# Patient Record
Sex: Female | Born: 1940 | Race: White | Hispanic: No | Marital: Married | Smoking: Never smoker
Health system: Southern US, Community
[De-identification: ages and names within clinical notes are randomized; demographics above are authoritative.]

## PROBLEM LIST (undated history)

## (undated) DIAGNOSIS — E7800 Pure hypercholesterolemia, unspecified: Secondary | ICD-10-CM

## (undated) DIAGNOSIS — M1990 Unspecified osteoarthritis, unspecified site: Secondary | ICD-10-CM

## (undated) DIAGNOSIS — C439 Malignant melanoma of skin, unspecified: Secondary | ICD-10-CM

## (undated) HISTORY — PX: ABDOMINAL HYSTERECTOMY: SHX81

## (undated) HISTORY — PX: BACK SURGERY: SHX140

## (undated) HISTORY — PX: TONSILLECTOMY: SUR1361

## (undated) HISTORY — DX: Malignant melanoma of skin, unspecified: C43.9

## (undated) HISTORY — PX: COLONOSCOPY: SHX174

---

## 2003-09-20 ENCOUNTER — Ambulatory Visit (HOSPITAL_COMMUNITY): Admission: RE | Admit: 2003-09-20 | Discharge: 2003-09-20 | Payer: Self-pay | Admitting: Internal Medicine

## 2007-01-12 ENCOUNTER — Ambulatory Visit (HOSPITAL_COMMUNITY): Admission: RE | Admit: 2007-01-12 | Discharge: 2007-01-12 | Payer: Self-pay | Admitting: *Deleted

## 2009-10-09 ENCOUNTER — Ambulatory Visit (HOSPITAL_COMMUNITY): Admission: RE | Admit: 2009-10-09 | Discharge: 2009-10-09 | Payer: Self-pay | Admitting: Family Medicine

## 2009-10-23 ENCOUNTER — Encounter: Admission: RE | Admit: 2009-10-23 | Discharge: 2009-10-23 | Payer: Self-pay | Admitting: Family Medicine

## 2009-11-07 ENCOUNTER — Encounter: Admission: RE | Admit: 2009-11-07 | Discharge: 2009-11-07 | Payer: Self-pay | Admitting: Family Medicine

## 2010-11-02 NOTE — Op Note (Signed)
NAME:  Brooke Chandler, Brooke Chandler                         ACCOUNT NO.:  [id number]   MEDICAL RECORD NO.:  [id number]                   PATIENT TYPE:  AMB   LOCATION:  DAY                                  FACILITY:  [facility]   PHYSICIAN:  Lionel December, M.D.                 DATE OF BIRTH:  [birth year]   DATE OF PROCEDURE:  09/20/2003  DATE OF DISCHARGE:                                 OPERATIVE REPORT   PROCEDURE:  Total colonoscopy.   ENDOSCOPIST:  Lionel December, M.D.   INDICATIONS:  Ms. Janowski is a 70 year old Caucasian female who is here for  screening colonoscopy.  Family history is negative for colorectal carcinoma.  The procedure and risks were reviewed with the patient and informed consent  was obtained.   PREOPERATIVE MEDICATIONS:  Demerol 25 mg IV and Versed 6 mg IV.   FINDINGS:  Procedure performed in endoscopy suite.  The patient's vital  signs and O2 saturation were monitored during the procedure and remained  stable.  The patient was placed in the left lateral recumbent position and  rectal examination was performed.  No abnormality noted on external or  digital exam.   Olympus videoscope was placed in the rectum and advanced under vision into  the sigmoid colon and beyond.  Preparation was satisfactory.  There were a  few small diverticula at sigmoid colon.  The scope was passed to cecum which  was identified by ileocecal valve and appendiceal orifice.  Pictures taken  for the record.  As the scope was withdrawn colonic mucosa was carefully  examined.  There were two small polyps in the midtransverse colon were  ablated by cold biopsy and submitted in 1 container. The mucosa of the rest  of the colon was normal.  Rectal mucosa similarly was normal..   The scope was retroflexed to examine the anorectal junction which was  unremarkable.  The endoscope was straightened and withdrawn.  The patient  tolerated the procedure well.   FINAL DIAGNOSES:  1. Two small polyps ablated by a  cold biopsy from midtransverse colon.  2. A few small diverticula at descending and sigmoid colon.   RECOMMENDATIONS:  1. Standard instructions given.  2. High fiber diet.  3. I will be contacting the patient with the biopsy results and further     recommendations.      ___________________________________________                                            Lionel December, M.D.   NR/MEDQ  D:  09/20/2003  T:  09/20/2003  Job:  [id number]   cc:   Mila Homer. Sudie Bailey, M.D.  [address] [place], [place] [address]  Fax: [phone]

## 2010-12-05 ENCOUNTER — Other Ambulatory Visit: Payer: Self-pay | Admitting: Family Medicine

## 2010-12-05 DIAGNOSIS — M5430 Sciatica, unspecified side: Secondary | ICD-10-CM

## 2010-12-06 ENCOUNTER — Other Ambulatory Visit: Payer: Self-pay

## 2010-12-27 ENCOUNTER — Ambulatory Visit
Admission: RE | Admit: 2010-12-27 | Discharge: 2010-12-27 | Disposition: A | Discharge status: Home / Self Care | Payer: Medicare Other | Source: Ambulatory Visit | Attending: Family Medicine | Admitting: Family Medicine

## 2010-12-27 VITALS — BP 116/60 | HR 77

## 2010-12-27 DIAGNOSIS — M5430 Sciatica, unspecified side: Secondary | ICD-10-CM

## 2010-12-27 NOTE — Progress Notes (Signed)
10:45 pt received in nursg area left leg completely numb, pt unable to stand or bear wt.dd

## 2010-12-27 NOTE — Progress Notes (Signed)
10:53 dr. Alfredo Batty in to visit. Husband sitting with pt.dd

## 2010-12-27 NOTE — Progress Notes (Signed)
1128: Sensation and movement slowly returning to patient's legs, but she still is unable to bear weight on them.  jkl 1200: Pt able to bear weight on legs; wheeled to car. jkl

## 2011-01-15 ENCOUNTER — Other Ambulatory Visit: Payer: Self-pay | Admitting: Family Medicine

## 2011-01-15 DIAGNOSIS — M545 Low back pain: Secondary | ICD-10-CM

## 2011-01-21 ENCOUNTER — Ambulatory Visit
Admission: RE | Admit: 2011-01-21 | Discharge: 2011-01-21 | Disposition: A | Discharge status: Home / Self Care | Payer: Medicare Other | Source: Ambulatory Visit | Attending: Family Medicine | Admitting: Family Medicine

## 2011-01-21 DIAGNOSIS — M545 Low back pain, unspecified: Secondary | ICD-10-CM

## 2011-01-21 MED ORDER — IOHEXOL 180 MG/ML  SOLN
1.0000 mL | Freq: Once | INTRAMUSCULAR | Status: AC | PRN
  Administered 2011-01-21: 1 mL via EPIDURAL

## 2011-01-21 MED ORDER — METHYLPREDNISOLONE ACETATE 40 MG/ML INJ SUSP (RADIOLOG
120.0000 mg | Freq: Once | INTRAMUSCULAR | Status: AC
  Administered 2011-01-21: 120 mg via EPIDURAL

## 2011-06-05 ENCOUNTER — Other Ambulatory Visit (HOSPITAL_COMMUNITY): Payer: Self-pay | Admitting: Family Medicine

## 2011-06-07 ENCOUNTER — Ambulatory Visit (HOSPITAL_COMMUNITY)
Admission: RE | Admit: 2011-06-07 | Discharge: 2011-06-07 | Disposition: A | Discharge status: Home / Self Care | Payer: Medicare Other | Source: Ambulatory Visit | Attending: Family Medicine | Admitting: Family Medicine

## 2011-06-07 DIAGNOSIS — Z13820 Encounter for screening for osteoporosis: Secondary | ICD-10-CM | POA: Insufficient documentation

## 2011-06-07 DIAGNOSIS — Z780 Asymptomatic menopausal state: Secondary | ICD-10-CM | POA: Insufficient documentation

## 2011-07-20 ENCOUNTER — Emergency Department (HOSPITAL_COMMUNITY)
Admission: EM | Admit: 2011-07-20 | Discharge: 2011-07-20 | Disposition: A | Discharge status: Home / Self Care | Payer: Medicare Other | Attending: Emergency Medicine | Admitting: Emergency Medicine

## 2011-07-20 ENCOUNTER — Encounter (HOSPITAL_COMMUNITY): Payer: Self-pay | Admitting: *Deleted

## 2011-07-20 DIAGNOSIS — Z9079 Acquired absence of other genital organ(s): Secondary | ICD-10-CM | POA: Insufficient documentation

## 2011-07-20 DIAGNOSIS — H8110 Benign paroxysmal vertigo, unspecified ear: Secondary | ICD-10-CM | POA: Insufficient documentation

## 2011-07-20 DIAGNOSIS — E7800 Pure hypercholesterolemia, unspecified: Secondary | ICD-10-CM | POA: Insufficient documentation

## 2011-07-20 HISTORY — DX: Pure hypercholesterolemia, unspecified: E78.00

## 2011-07-20 LAB — BASIC METABOLIC PANEL
CO2: 28 mEq/L (ref 19–32)
Calcium: 9.4 mg/dL (ref 8.4–10.5)
Chloride: 106 mEq/L (ref 96–112)
GFR calc Af Amer: >90 mL/min (ref >90)
Glucose, Bld: 91 mg/dL (ref 70–99)
Potassium: 4.5 mEq/L (ref 3.5–5.1)

## 2011-07-20 LAB — DIFFERENTIAL
Lymphocytes Relative: 23 % (ref 12–46)
Lymphs Abs: 1.9 10*3/uL (ref 0.7–4.0)
Neutrophils Relative %: 69 % (ref 43–77)

## 2011-07-20 LAB — CBC
Platelets: 301 10*3/uL (ref 150–400)
RBC: 3.87 MIL/uL (ref 3.87–5.11)
RDW: 12.9 % (ref 11.5–15.5)

## 2011-07-20 MED ORDER — MECLIZINE HCL 12.5 MG PO TABS
25.0000 mg | ORAL_TABLET | Freq: Once | ORAL | Status: AC
  Administered 2011-07-20: 25 mg via ORAL
  Filled 2011-07-20: qty 2

## 2011-07-20 MED ORDER — MECLIZINE HCL 25 MG PO TABS: 25.0000 mg | ORAL_TABLET | Freq: Three times a day (TID) | ORAL | Status: AC | PRN

## 2011-07-20 NOTE — ED Provider Notes (Signed)
History     CSN: [id number]  Arrival date & time 07/20/11  1405   First MD Initiated Contact with Patient 07/20/11 1430      Chief Complaint  Patient presents with  . Dizziness    (Consider location/radiation/quality/duration/timing/severity/associated sxs/prior treatment) The history is provided by the patient.  71 year old female woke up at 4 AM and went to the bathroom and noted intense vertigo. She went back to bed and was doing generally well, but when she got up later in the morning, she noted that she was still having vertigo. She describes vertigo as the room spinning. There is no associated nausea and no balance difficulty. She's not had any headache or ear pain and has denies any hearing loss. She denies tinnitus. Since getting up in the morning, she notes that the vertigo is better when she sits sits or stands and is worse when she lays flat. She has had vertigo in the past. She's not taken any medication. Symptoms are severe.  Past Medical History  Diagnosis Date  . High cholesterol     Past Surgical History  Procedure Date  . Abdominal hysterectomy   . Back surgery   . Tonsillectomy     History reviewed. No pertinent family history.  History  Substance Use Topics  . Smoking status: Never Smoker   . Smokeless tobacco: Not on file  . Alcohol Use: No    OB History    Grav Para Term Preterm Abortions TAB SAB Ect Mult Living                  Review of Systems  All other systems reviewed and are negative.  for a long time, she has had episodes of numbness in her left hand. It will come with no particular pattern, and will only be present for a few minutes at a time. It is her entire hand that gets numb.  Allergies  Bee venom  Home Medications  No current outpatient prescriptions on file.  BP 124/76  Pulse 72  Temp(Src) 97.8 F (36.6 C) (Oral)  Resp 18  Ht 5\' 5"  (1.651 m)  Wt 125 lb (56.7 kg)  BMI 20.80 kg/m2  SpO2 100%  Physical Exam  Nursing  note and vitals reviewed. 71 year old female is resting comfortably and in no acute distress. Vital signs are normal. Oxygen saturation is 100% which is normal. Head is normocephalic and atraumatic. PERRLA, EOMI. TMs are clear. Oropharynx is clear. Neck is supple without adenopathy, JVD, or bruit. Back is nontender. Lungs are clear without rales, wheezes, rhonchi. Heart has regular rate rhythm without murmur. Abdomen is soft, flat, nontender without masses or hepatosplenomegaly. Extremities have no cyanosis or edema. Skin is warm and moist without rash. Neurologic: Mental status is normal, cranial nerves are intact, there no focal motor or sensory deficits. Coordination is normal. Dizziness is somewhat reproduced by head movement. She does not have any nystagmus.  ED Course  Procedures (including critical care time)  Results for orders placed during the hospital encounter of 07/20/11  CBC      Component Value Range   WBC 8.1  4.0 - 10.5 (K/uL)   RBC 3.87  3.87 - 5.11 (MIL/uL)   Hemoglobin 12.5  12.0 - 15.0 (g/dL)   HCT 40.9  81.1 - 91.4 (%)   MCV 96.1  78.0 - 100.0 (fL)   MCH 32.3  26.0 - 34.0 (pg)   MCHC 33.6  30.0 - 36.0 (g/dL)   RDW 78.2  95.6 -  15.5 (%)   Platelets 301  150 - 400 (K/uL)  DIFFERENTIAL      Component Value Range   Neutrophils Relative 69  43 - 77 (%)   Neutro Abs 5.6  1.7 - 7.7 (K/uL)   Lymphocytes Relative 23  12 - 46 (%)   Lymphs Abs 1.9  0.7 - 4.0 (K/uL)   Monocytes Relative 7  3 - 12 (%)   Monocytes Absolute 0.6  0.1 - 1.0 (K/uL)   Eosinophils Relative 1  0 - 5 (%)   Eosinophils Absolute 0.0  0.0 - 0.7 (K/uL)   Basophils Relative 0  0 - 1 (%)   Basophils Absolute 0.0  0.0 - 0.1 (K/uL)  BASIC METABOLIC PANEL      Component Value Range   Sodium 141  135 - 145 (mEq/L)   Potassium 4.5  3.5 - 5.1 (mEq/L)   Chloride 106  96 - 112 (mEq/L)   CO2 28  19 - 32 (mEq/L)   Glucose, Bld 91  70 - 99 (mg/dL)   BUN 9  6 - 23 (mg/dL)   Creatinine, Ser 1.61  0.50 - 1.10  (mg/dL)   Calcium 9.4  8.4 - 09.6 (mg/dL)   GFR calc non Af Amer 88 (*) >90 (mL/min)   GFR calc Af Amer >90  >90 (mL/min)   No results found.  1600: after oral meclizine, dizziness is resolved. She lay down and without recurrence of vertigo.  1. Benign positional vertigo       MDM  Vertigo in a pattern most consistent with benign positional vertigo.        Dione Booze, MD 07/20/11 [phone]

## 2011-07-20 NOTE — ED Notes (Signed)
Pt c/o dizziness while lying down and tingling in her left arm and leg. States that she is not dizzy while she is upright. Denies chest pain, shortness of breath or nausea.

## 2011-12-04 ENCOUNTER — Telehealth (INDEPENDENT_AMBULATORY_CARE_PROVIDER_SITE_OTHER): Payer: Self-pay

## 2011-12-04 NOTE — Telephone Encounter (Signed)
LMOM for pt that appt moved up per Dr Scharlene Gloss request to 6-24.

## 2011-12-09 ENCOUNTER — Ambulatory Visit (INDEPENDENT_AMBULATORY_CARE_PROVIDER_SITE_OTHER): Payer: Medicare Other | Admitting: Surgery

## 2011-12-09 ENCOUNTER — Encounter (INDEPENDENT_AMBULATORY_CARE_PROVIDER_SITE_OTHER): Payer: Self-pay | Admitting: Surgery

## 2011-12-09 VITALS — BP 126/74 | HR 76 | Temp 98.4°F | Resp 14 | Ht 66.0 in | Wt 123.6 lb

## 2011-12-09 DIAGNOSIS — C4370 Malignant melanoma of unspecified lower limb, including hip: Secondary | ICD-10-CM

## 2011-12-09 NOTE — Patient Instructions (Signed)
Melanoma  Melanoma is the least common, but most dangerous, form of skin cancer. This is because it can spread (metastasize) to other organs and can be life-threatening. Melanoma is a cancerous (malignant) tumor that begins in a certain type of cells, called melanocytes. Melanocytes are the cells that produce the color (pigment) called melanin. Melanin colors our skin, hair, eyes, and moles.  CAUSES    The exact cause of melanoma is unknown. You may have a higher risk if you:   Spend or have spent a lot of time in the sun. This includes sunlamp and tanning booth exposure.    Have had sunburns. This put you at a particularly increased risk for melanoma. The more blistering sunburns a person has, the higher the risk.    Spend time in parts of the world with more intense sunlight.    Have fair skin that does not tan easily. You may have a lower risk if you have a darker skin color. However, people with darker skin can get melanoma, especially on the hands and feet (acral areas).    Have a close relative (parent, sibling) who has melanoma.    Have a large number of skin moles (more than 100).   SYMPTOMS    A skin mole is suspicious if it has any of these 5 traits. This is called the ABCDE's of melanoma:   Asymmetry: Irregular shape, not simply round or oval.    Border: Edge of the mole is irregular, not smooth.    Color: Mole may have multiple colors in it, including brown, black, blue, red, or tan.    Diameter: More than 0.2 inches (6 mm) across.    Evolving: Any unusual change or symptoms in the mole, such as pain, itching, stinging, sensitivity, or bleeding.   A mole that is noticeably changing in appearance, or any new mole, should be checked for melanoma. In general, people develop new moles until age 30. New moles after this age should be brought to the attention of your caregiver.  DIAGNOSIS     Your caregiver can look at your skin and find lesions or moles that may be suspicious. A patient may also notice a mole with symptoms or a mole that does not look like most of the other moles on his or her body. This is called the "ugly duckling" sign. A tissue sample (biopsy) examined under a microscope is needed to determine if it is melanoma. The size and extent of the biopsy will depend on the location, size, and appearance of the skin lesion or mole. The biopsy can also reveal whether melanoma has spread to deeper layers of the skin.  TREATMENT    Surgery to completely remove the melanoma is required. Lymph nodes may also be removed. If the melanoma has spread to other organs, such as the liver, lungs, bone, or brain, cancer-fighting drugs (chemotherapy) must be used. Your caregiver will discuss your treatment options with you. You can ask about being included in a clinical trial to evaluate new forms of treatment. Melanoma can occasionally recur years after the initial diagnosis. If you have melanoma, you will need follow-up visits with your caregiver for many years.  PREVENTION    Risk for melanoma can be reduced by minimizing sun exposure. Practice the 3 S's:   Slip on a shirt.    Slop on sunscreen.    Slap on a hat.   Do not spend time in the sun during peak midafternoon hours. Sunscreen/sunblock with SPF   30 or higher and UVA/UVB block should be applied regularly. You should do this even during brief exposure to sunlight. You should also do this on cloudy days and in winter, even though the perceived sunlight is less. Always avoid sunburn! Wear sunglasses that block UV light. Be sure to see your caregiver if you have any new or changing moles.  HOME CARE INSTRUCTIONS     Follow wound care instructions after surgical removal of your melanoma.    Practice good sun avoidance and protective measures as described above.     Let your close family members (parents, children, siblings) know about your diagnosis. This puts them at a higher risk of getting melanoma than the general population.   SEEK MEDICAL CARE IF:     You notice any new moles, or you have any moles that are changing.    You have had a melanoma removed and you notice a new growth near the same location.    You have had a melanoma removed and you experience any new or unexplained health problems.   Document Released: 06/03/2005 Document Revised: 05/23/2011 Document Reviewed: 09/22/2009  Exit[employer] Patient Information 2012 [employer].

## 2011-12-09 NOTE — Progress Notes (Signed)
General Surgery - [facility]  Chief Complaint  Patient presents with  . Brooke Chandler    new pt- eval Brooke Chandler on Rt leg - referral from Dr. Jack Hall, dermatology; primary is Dr. Stephen Knowlton    HISTORY: Patient is an 71-year-old white female with newly diagnosed malignant Brooke Chandler involving the posterior right calf. Patient underwent local excision at the office of her dermatologist. Pathology shows a superficial spreading type malignant Brooke Chandler, Clark's level II, Breslow depth of 0.25 mm.  Patient is now referred for wide local excision to confirm adequate resection.  Past Medical History  Diagnosis Date  . High cholesterol   . Brooke Chandler     right leg  . Osteoporosis      Current Outpatient Prescriptions  Medication Sig Dispense Refill  . Acetaminophen (TYLENOL ARTHRITIS PAIN PO) Take 2 capsules by mouth 2 (two) times daily.      . alendronate (FOSAMAX) 10 MG tablet Take 10 mg by mouth daily before breakfast. Take with a full glass of water on an empty stomach.      . aspirin 81 MG chewable tablet Chew 81 mg by mouth daily.      . calcium-vitamin D (OSCAL WITH D) 500-200 MG-UNIT per tablet Take 1 tablet by mouth 2 (two) times daily.      . pravastatin (PRAVACHOL) 40 MG tablet Take 40 mg by mouth daily.      . raloxifene (EVISTA) 60 MG tablet Take 60 mg by mouth at bedtime.      . vitamin B-12 (CYANOCOBALAMIN) 1000 MCG tablet Take 1,000 mcg by mouth 2 (two) times a week.         Allergies  Allergen Reactions  . Bee Venom      Family History  Problem Relation Age of Onset  . Heart disease Mother   . Cancer Father     prostate  . Heart disease Father      History   Social History  . Marital Status: Married    Spouse Name: N/A    Number of Children: N/A  . Years of Education: N/A   Social History Main Topics  . Smoking status: Never Smoker   . Smokeless tobacco: None  . Alcohol Use: No  . Drug Use: No  . Sexually Active:    Other Topics  Concern  . None   Social History Narrative  . None     REVIEW OF SYSTEMS - PERTINENT POSITIVES ONLY: Denies lymphadenopathy. Denies previous Brooke Chandler.  EXAM: Filed Vitals:   12/09/11 1000  BP: 126/74  Pulse: 76  Temp: 98.4 F (36.9 C)  Resp: 14    HEENT: normocephalic; pupils equal and reactive; sclerae clear; dentition good; mucous membranes moist NECK:  symmetric on extension; no palpable anterior or posterior cervical lymphadenopathy; no supraclavicular masses; no tenderness CHEST: clear to auscultation bilaterally without rales, rhonchi, or wheezes CARDIAC: regular rate and rhythm without significant murmur; peripheral pulses are full EXT:  non-tender without edema; no deformity; 1.5 cm ulcerated wound right posterior calf with dry eschar; no sign of cellulitis NEURO: no gross focal deficits; no sign of tremor   LABORATORY RESULTS: See [facility] (CHL-Epic) for most recent results   RADIOLOGY RESULTS: See [facility] (CHL-Epic) for most recent results   IMPRESSION: Malignant Brooke Chandler, superficial spreading type, Clark's level II, right posterior calf  PLAN: I discussed the above findings with the patient and her husband. We will schedule her for wide local excision of the lesion on the   right posterior calf under local anesthesia and sedation as an outpatient procedure. This will be done at the [facility].  The risks and benefits of the procedure have been discussed at length with the patient.  The patient understands the proposed procedure, potential alternative treatments, and the course of recovery to be expected.  All of the patient's questions have been answered at this time.  The patient wishes to proceed with surgery.  12/09/2011  Christabel Camire M. Secret Kristensen, MD, FACS General & Endocrine Surgery [facility]   Visit Diagnoses: 1. Brooke Chandler of lower leg, right     Primary Care Physician: KNOWLTON,STEPHEN D, MD   

## 2011-12-11 ENCOUNTER — Encounter (HOSPITAL_BASED_OUTPATIENT_CLINIC_OR_DEPARTMENT_OTHER): Payer: Self-pay | Admitting: *Deleted

## 2011-12-11 NOTE — Progress Notes (Signed)
No labs needed

## 2011-12-17 ENCOUNTER — Ambulatory Visit (HOSPITAL_BASED_OUTPATIENT_CLINIC_OR_DEPARTMENT_OTHER): Payer: Medicare Other | Admitting: Anesthesiology

## 2011-12-17 ENCOUNTER — Encounter (HOSPITAL_BASED_OUTPATIENT_CLINIC_OR_DEPARTMENT_OTHER): Payer: Self-pay | Admitting: *Deleted

## 2011-12-17 ENCOUNTER — Encounter (HOSPITAL_BASED_OUTPATIENT_CLINIC_OR_DEPARTMENT_OTHER): Payer: Self-pay | Admitting: Surgery

## 2011-12-17 ENCOUNTER — Encounter (HOSPITAL_BASED_OUTPATIENT_CLINIC_OR_DEPARTMENT_OTHER)
Admission: RE | Disposition: A | Discharge status: Home / Self Care | Payer: Self-pay | Source: Ambulatory Visit | Attending: Surgery

## 2011-12-17 ENCOUNTER — Encounter (HOSPITAL_BASED_OUTPATIENT_CLINIC_OR_DEPARTMENT_OTHER): Payer: Self-pay | Admitting: Anesthesiology

## 2011-12-17 ENCOUNTER — Ambulatory Visit (HOSPITAL_BASED_OUTPATIENT_CLINIC_OR_DEPARTMENT_OTHER)
Admission: RE | Admit: 2011-12-17 | Discharge: 2011-12-17 | Disposition: A | Discharge status: Home / Self Care | Payer: Medicare Other | Source: Ambulatory Visit | Attending: Surgery | Admitting: Surgery

## 2011-12-17 DIAGNOSIS — C4370 Malignant melanoma of unspecified lower limb, including hip: Secondary | ICD-10-CM | POA: Insufficient documentation

## 2011-12-17 HISTORY — DX: Unspecified osteoarthritis, unspecified site: M19.90

## 2011-12-17 HISTORY — PX: MELANOMA EXCISION: SHX5266

## 2011-12-17 SURGERY — EXCISION, MELANOMA
Anesthesia: Monitor Anesthesia Care | Site: Leg Lower | Laterality: Right | Wound class: Clean

## 2011-12-17 MED ORDER — METOCLOPRAMIDE HCL 5 MG/ML IJ SOLN: 10.0000 mg | Freq: Once | INTRAMUSCULAR | Status: DC | PRN

## 2011-12-17 MED ORDER — PROPOFOL 10 MG/ML IV EMUL
INTRAVENOUS | Status: DC | PRN
  Administered 2011-12-17: 35 ug/kg/min via INTRAVENOUS

## 2011-12-17 MED ORDER — MIDAZOLAM HCL 5 MG/5ML IJ SOLN
INTRAMUSCULAR | Status: DC | PRN
  Administered 2011-12-17: 1 mg via INTRAVENOUS

## 2011-12-17 MED ORDER — HYDROCODONE-ACETAMINOPHEN 5-325 MG PO TABS: 1.0000 | ORAL_TABLET | ORAL | Status: AC | PRN

## 2011-12-17 MED ORDER — FENTANYL CITRATE 0.05 MG/ML IJ SOLN: 25.0000 ug | INTRAMUSCULAR | Status: DC | PRN

## 2011-12-17 MED ORDER — OXYCODONE HCL 5 MG PO TABS: 5.0000 mg | ORAL_TABLET | Freq: Once | ORAL | Status: DC | PRN

## 2011-12-17 MED ORDER — BUPIVACAINE HCL (PF) 0.5 % IJ SOLN
INTRAMUSCULAR | Status: DC | PRN
  Administered 2011-12-17: 25 mL

## 2011-12-17 MED ORDER — ONDANSETRON HCL 4 MG/2ML IJ SOLN
INTRAMUSCULAR | Status: DC | PRN
  Administered 2011-12-17: 4 mg via INTRAVENOUS

## 2011-12-17 MED ORDER — LIDOCAINE HCL (CARDIAC) 20 MG/ML IV SOLN
INTRAVENOUS | Status: DC | PRN
  Administered 2011-12-17: 50 mg via INTRAVENOUS

## 2011-12-17 MED ORDER — CEFAZOLIN SODIUM 1-5 GM-% IV SOLN
1.0000 g | INTRAVENOUS | Status: AC
  Administered 2011-12-17: 1 g via INTRAVENOUS

## 2011-12-17 MED ORDER — LACTATED RINGERS IV SOLN
INTRAVENOUS | Status: DC
  Administered 2011-12-17 (×2): via INTRAVENOUS

## 2011-12-17 MED ORDER — FENTANYL CITRATE 0.05 MG/ML IJ SOLN
INTRAMUSCULAR | Status: DC | PRN
  Administered 2011-12-17: 25 ug via INTRAVENOUS

## 2011-12-17 SURGICAL SUPPLY — 46 items
APL SKNCLS STERI-STRIP NONHPOA (GAUZE/BANDAGES/DRESSINGS)
BANDAGE GAUZE ELAST BULKY 4 IN (GAUZE/BANDAGES/DRESSINGS) IMPLANT
BENZOIN TINCTURE PRP APPL 2/3 (GAUZE/BANDAGES/DRESSINGS) IMPLANT
BLADE SURG 15 STRL LF DISP TIS (BLADE) ×1 IMPLANT
BLADE SURG 15 STRL SS (BLADE) ×2
BNDG COHESIVE 4X5 TAN STRL (GAUZE/BANDAGES/DRESSINGS) ×2 IMPLANT
CHLORAPREP W/TINT 26ML (MISCELLANEOUS) ×2 IMPLANT
CLEANER CAUTERY TIP 5X5 PAD (MISCELLANEOUS) IMPLANT
CLOTH BEACON ORANGE TIMEOUT ST (SAFETY) ×2 IMPLANT
COVER MAYO STAND STRL (DRAPES) ×2 IMPLANT
COVER TABLE BACK 60X90 (DRAPES) ×2 IMPLANT
DECANTER SPIKE VIAL GLASS SM (MISCELLANEOUS) IMPLANT
DRAPE EXTREMITY T 121X128X90 (DRAPE) ×1 IMPLANT
DRAPE PED LAPAROTOMY (DRAPES) ×2 IMPLANT
DRAPE U-SHAPE 76X120 STRL (DRAPES) IMPLANT
DRAPE UTILITY XL STRL (DRAPES) ×2 IMPLANT
DRSG TEGADERM 4X4.75 (GAUZE/BANDAGES/DRESSINGS) ×1 IMPLANT
ELECT REM PT RETURN 9FT ADLT (ELECTROSURGICAL) ×2
ELECTRODE REM PT RTRN 9FT ADLT (ELECTROSURGICAL) ×1 IMPLANT
GAUZE SPONGE 4X4 12PLY STRL LF (GAUZE/BANDAGES/DRESSINGS) ×2 IMPLANT
GLOVE BIO SURGEON STRL SZ7 (GLOVE) ×1 IMPLANT
GLOVE BIOGEL PI IND STRL 7.0 (GLOVE) ×1 IMPLANT
GLOVE BIOGEL PI INDICATOR 7.0 (GLOVE) ×1
GLOVE SKINSENSE NS SZ7.0 (GLOVE) ×1
GLOVE SKINSENSE STRL SZ7.0 (GLOVE) ×1 IMPLANT
GLOVE SURG ORTHO 8.0 STRL STRW (GLOVE) ×2 IMPLANT
GOWN PREVENTION PLUS XLARGE (GOWN DISPOSABLE) ×2 IMPLANT
GOWN PREVENTION PLUS XXLARGE (GOWN DISPOSABLE) ×3 IMPLANT
NDL HYPO 25X1 1.5 SAFETY (NEEDLE) ×1 IMPLANT
NEEDLE HYPO 25X1 1.5 SAFETY (NEEDLE) ×2 IMPLANT
PACK BASIN DAY SURGERY FS (CUSTOM PROCEDURE TRAY) ×2 IMPLANT
PAD CLEANER CAUTERY TIP 5X5 (MISCELLANEOUS)
PENCIL BUTTON HOLSTER BLD 10FT (ELECTRODE) ×2 IMPLANT
SHEET MEDIUM DRAPE 40X70 STRL (DRAPES) ×1 IMPLANT
SLEEVE SCD COMPRESS KNEE MED (MISCELLANEOUS) IMPLANT
SPONGE GAUZE 4X4 12PLY (GAUZE/BANDAGES/DRESSINGS) ×1 IMPLANT
STOCKINETTE 4X48 STRL (DRAPES) IMPLANT
STRIP CLOSURE SKIN 1/2X4 (GAUZE/BANDAGES/DRESSINGS) IMPLANT
SUT ETHILON 3 0 PS 1 (SUTURE) ×3 IMPLANT
SUT VICRYL 3-0 CR8 SH (SUTURE) ×1 IMPLANT
SUT VICRYL 4-0 PS2 18IN ABS (SUTURE) IMPLANT
SYR CONTROL 10ML LL (SYRINGE) ×2 IMPLANT
TAPE CLOTH SURG 4X10 WHT LF (GAUZE/BANDAGES/DRESSINGS) ×1 IMPLANT
TOWEL OR 17X24 6PK STRL BLUE (TOWEL DISPOSABLE) ×4 IMPLANT
TOWEL OR NON WOVEN STRL DISP B (DISPOSABLE) ×2 IMPLANT
WATER STERILE IRR 1000ML POUR (IV SOLUTION) ×1 IMPLANT

## 2011-12-17 NOTE — Op Note (Signed)
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  PROCEDURE:  Wide local excision with 1 cm margin, malignant melanoma, right posterior calf.  SURGEON:  Velora Heckler, MD, FACS  ANESTHESIA:  Local with intravenous sedation.  PREPARATION:  ChloraPrep.  COMPLICATIONS:  None.  INDICATIONS:  The patient is a 71 year old white female, referred by her dermatologist for wide local excision of superficial spreading, thin, malignant melanoma.  BODY OF REPORT:  Procedure was done in OR number 6 at the [facility].  The patient was brought to the operating room and placed in a left lateral decubitus position.  Following administration of intravenous sedation, the patient was prepped and draped in the usual strict aseptic fashion.  After ascertaining that an adequate level with sedation had been achieved, the skin around the ulcerated lesion on the right posterior calf was measured and marked.  A 1-cm margin was needed. Using a #15 blade, an elliptical incision was made so as to encompass the entire lesion with a 1-cm margin.  Dissection was carried full- thickness through the skin and subcutaneous tissues down to muscle fascia.  The entire ellipse of skin was excised.  A single sutures used to mark the superior margin.  A double sutures used to mark the lateral margin.  The specimen was submitted to Pathology in its entirety.  Good hemostasis was achieved with electrocautery.  Skin flaps were elevated circumferentially.   Subcutaneous tissues were closed with interrupted 3-0 Vicryl simple sutures.  The skin was closed with interrupted 3-0 nylon simple and vertical mattress sutures.  Antibiotic ointment was placed along the suture line.  Dry gauze dressings were placed.  The patient was awakened from anesthesia and brought to the recovery room.  The patient tolerated the procedure well.  Velora Heckler, MD, [facility] Office: [phone]    TMG/MEDQ  D:  12/17/2011  T:  12/17/2011  Job:  [id number]  cc:   Mertha Finders., M.D.

## 2011-12-17 NOTE — Transfer of Care (Signed)
Immediate Anesthesia Transfer of Care Note  Patient: Brooke Chandler  Procedure(s) Performed: Procedure(s) (LRB): MELANOMA EXCISION (Right)  Patient Location: PACU  Anesthesia Type: MAC  Level of Consciousness: awake and alert   Airway & Oxygen Therapy: Patient Spontanous Breathing and Patient connected to face mask oxygen  Post-op Assessment: Report given to PACU RN and Post -op Vital signs reviewed and stable  Post vital signs: Reviewed and stable  Complications: No apparent anesthesia complications

## 2011-12-17 NOTE — Anesthesia Postprocedure Evaluation (Signed)
Anesthesia Post Note  Patient: Brooke Chandler  Procedure(s) Performed: Procedure(s) (LRB): MELANOMA EXCISION (Right)  Anesthesia type: MAC  Patient location: PACU  Post pain: Pain level controlled  Post assessment: Patient's Cardiovascular Status Stable  Last Vitals:  Filed Vitals:   12/17/11 0912  BP: 128/50  Pulse: 71  Temp: 36.6 C  Resp: 20    Post vital signs: Reviewed and stable  Level of consciousness: alert  Complications: No apparent anesthesia complications

## 2011-12-17 NOTE — Interval H&P Note (Signed)
History and Physical Interval Note:  12/17/2011 7:19 AM  Brooke Chandler  has presented today for surgery, with the diagnosis of melanoma right calf.  The various methods of treatment have been discussed with the patient and family. After consideration of risks, benefits and other options for treatment, the patient has consented to    Procedure(s) (LRB): MELANOMA EXCISION (Right) as a surgical intervention .    The patient's history has been reviewed, patient examined, no change in status, stable for surgery.  I have reviewed the patients' chart and labs.  Questions were answered to the patient's satisfaction.    Velora Heckler, MD, [facility] Office: [phone]    July Linam Judie Petit

## 2011-12-17 NOTE — Anesthesia Preprocedure Evaluation (Signed)
Anesthesia Evaluation  Patient identified by MRN, date of birth, ID band Patient awake    Reviewed: Allergy & Precautions, H&P , NPO status , Patient's Chart, lab work & pertinent test results, reviewed documented beta blocker date and time   Airway Mallampati: II TM Distance: >3 FB Neck ROM: full    Dental   Pulmonary neg pulmonary ROS,          Cardiovascular negative cardio ROS      Neuro/Psych negative neurological ROS  negative psych ROS   GI/Hepatic negative GI ROS, Neg liver ROS,   Endo/Other  negative endocrine ROS  Renal/GU negative Renal ROS  negative genitourinary   Musculoskeletal   Abdominal   Peds  Hematology negative hematology ROS (+)   Anesthesia Other Findings See surgeon's H&P   Reproductive/Obstetrics negative OB ROS                           Anesthesia Physical Anesthesia Plan  ASA: II  Anesthesia Plan: MAC   Post-op Pain Management:    Induction:   Airway Management Planned: Simple Face Mask  Additional Equipment:   Intra-op Plan:   Post-operative Plan:   Informed Consent: I have reviewed the patients History and Physical, chart, labs and discussed the procedure including the risks, benefits and alternatives for the proposed anesthesia with the patient or authorized representative who has indicated his/her understanding and acceptance.   Dental Advisory Given  Plan Discussed with: CRNA and Surgeon  Anesthesia Plan Comments:         Anesthesia Quick Evaluation

## 2011-12-17 NOTE — H&P (View-Only) (Signed)
[facility]  Chief Complaint  Patient presents with  . Brooke Chandler    new pt- eval Brooke Chandler on Rt leg - referral from Dr. Suan Halter, dermatology; primary is Dr. 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 Patient underwent local excision at the office of her dermatologist. Pathology shows a superficial spreading type malignant Brooke Chandler, Clark's level II, Breslow depth of 0.25 mm.  Patient is now referred for wide local excision to confirm adequate resection.  Past Medical History  Diagnosis Date  . High cholesterol   . Brooke Chandler     right leg  . Osteoporosis      Current Outpatient Prescriptions  Medication Sig Dispense Refill  . Acetaminophen (TYLENOL ARTHRITIS PAIN PO) Take 2 capsules by mouth 2 (two) times daily.      Marland Kitchen alendronate (FOSAMAX) 10 MG tablet Take 10 mg by mouth daily before breakfast. Take with a full glass of water on an empty stomach.      Marland Kitchen aspirin 81 MG chewable tablet Chew 81 mg by mouth daily.      . calcium-vitamin D (OSCAL WITH D) 500-200 MG-UNIT per tablet Take 1 tablet by mouth 2 (two) times daily.      . pravastatin (PRAVACHOL) 40 MG tablet Take 40 mg by mouth daily.      . raloxifene (EVISTA) 60 MG tablet Take 60 mg by mouth at bedtime.      . vitamin B-12 (CYANOCOBALAMIN) 1000 MCG tablet Take 1,000 mcg by mouth 2 (two) times a week.         Allergies  Allergen Reactions  . Bee Venom      Family History  Problem Relation Age of Onset  . Heart disease Mother   . Cancer Father     prostate  . Heart disease Father      History   Social History  . Marital Status: Married    Spouse Name: N/A    Number of Children: N/A  . Years of Education: N/A   Social History Main Topics  . Smoking status: Never Smoker   . Smokeless tobacco: None  . Alcohol Use: No  . Drug Use: No  . Sexually Active:    Other Topics  Concern  . None   Social History Narrative  . None     REVIEW OF SYSTEMS - PERTINENT POSITIVES ONLY: Denies lymphadenopathy. Denies previous Brooke Chandler.  EXAM: Filed Vitals:   12/09/11 1000  BP: 126/74  Pulse: 76  Temp: 98.4 F (36.9 C)  Resp: 14    HEENT: normocephalic; pupils equal and reactive; sclerae clear; dentition good; mucous membranes moist NECK:  symmetric on extension; no palpable anterior or posterior cervical lymphadenopathy; no supraclavicular masses; no tenderness CHEST: clear to auscultation bilaterally without rales, rhonchi, or wheezes CARDIAC: regular rate and rhythm without significant murmur; peripheral pulses are full EXT:  non-tender without edema; no deformity; 1.5 cm ulcerated wound right posterior calf with dry eschar; no sign of cellulitis NEURO: no gross focal deficits; no sign of tremor   LABORATORY RESULTS: See [facility] (CHL-Epic) for most recent results   RADIOLOGY RESULTS: See [facility] (CHL-Epic) for most recent results   IMPRESSION: Malignant Brooke Chandler, superficial spreading type, Clark's level II, right posterior calf  PLAN: I discussed the above findings with the patient and her husband. We will schedule her for wide local excision of the lesion on the  right posterior calf under local anesthesia and sedation as an outpatient procedure. This will be done at the [facility].  The risks and benefits of the procedure have been discussed at length with the patient.  The patient understands the proposed procedure, potential alternative treatments, and the course of recovery to be expected.  All of the patient's questions have been answered at this time.  The patient wishes to proceed with surgery.  12/09/2011  Velora Heckler, MD, FACS General & Endocrine Surgery [facility]   Visit Diagnoses: 1. Brooke Chandler of lower leg, right     Primary Care Physician: Milana Obey, MD

## 2011-12-17 NOTE — Brief Op Note (Signed)
12/17/2011  8:19 AM  PATIENT:  Brooke Chandler  71 y.o. female  PRE-OPERATIVE DIAGNOSIS:  melanoma right calf  POST-OPERATIVE DIAGNOSIS:  melanoma right calf  PROCEDURE:  Wide local excision of malignant melanoma, 1 cm margin, right posterior calf  SURGEON:  Surgeon(s) and Role:    * Velora Heckler, MD - Primary  ANESTHESIA:   IV sedation  EBL:     BLOOD ADMINISTERED:none  DRAINS: none   LOCAL MEDICATIONS USED:  MARCAINE     SPECIMEN:  Excision  DISPOSITION OF SPECIMEN:  PATHOLOGY  COUNTS:  YES  TOURNIQUET:  * No tourniquets in log *  DICTATION: .Other Dictation: Dictation Number [id number]  PLAN OF CARE: Discharge to home after PACU  PATIENT DISPOSITION:  PACU - hemodynamically stable.   Delay start of Pharmacological VTE agent (>24hrs) due to surgical blood loss or risk of bleeding: yes  Velora Heckler, MD, [facility] Office: [phone]

## 2011-12-17 NOTE — Discharge Instructions (Signed)

## 2011-12-18 ENCOUNTER — Ambulatory Visit (INDEPENDENT_AMBULATORY_CARE_PROVIDER_SITE_OTHER): Payer: Medicare Other | Admitting: Surgery

## 2011-12-18 NOTE — Progress Notes (Signed)
Quick Note:  Please contact patient and notify of benign pathology results.  Brooke Chandler M. Daishon Chui, MD, FACS [facility] Office: [phone]   ______ 

## 2011-12-20 ENCOUNTER — Encounter (HOSPITAL_BASED_OUTPATIENT_CLINIC_OR_DEPARTMENT_OTHER): Payer: Self-pay | Admitting: Surgery

## 2011-12-31 ENCOUNTER — Encounter (INDEPENDENT_AMBULATORY_CARE_PROVIDER_SITE_OTHER): Payer: Self-pay | Admitting: Surgery

## 2011-12-31 ENCOUNTER — Encounter (INDEPENDENT_AMBULATORY_CARE_PROVIDER_SITE_OTHER): Payer: Self-pay

## 2011-12-31 ENCOUNTER — Ambulatory Visit (INDEPENDENT_AMBULATORY_CARE_PROVIDER_SITE_OTHER): Payer: Medicare Other | Admitting: Surgery

## 2011-12-31 VITALS — BP 108/64 | HR 80 | Temp 98.7°F | Ht 65.0 in | Wt 123.2 lb

## 2011-12-31 DIAGNOSIS — C4370 Malignant melanoma of unspecified lower limb, including hip: Secondary | ICD-10-CM

## 2011-12-31 NOTE — Progress Notes (Signed)
General Surgery [facility]  Visit Diagnoses: 1. Melanoma of lower leg, right     HISTORY: Patient returns for postoperative visit 2 weeks after having undergone wide local excision of malignant melanoma from the right posterior calf. Final pathology showed and ulcerated skin biopsy site with no residual melanoma identified.  EXAM: Wound is well healed. Sutures are removed and benzoin and Steri-Strips are applied.  IMPRESSION: Status post wide local excision of malignant melanoma right calf, no residual malignancy  PLAN: Wound care instructions are given to the patient. Steri-Strips will be removed in 5 days. Patient will begin applying topical creams to her incision. Patient will return to see me as needed.  Velora Heckler, MD, FACS General & Endocrine Surgery [facility]

## 2011-12-31 NOTE — Patient Instructions (Signed)
  COCOA BUTTER & VITAMIN E CREAM  (Palmer's or other brand)  Apply cocoa butter/vitamin E cream to your incision 2 - 3 times daily.  Massage cream into incision for one minute with each application.  Use sunscreen (50 SPF or higher) for first 6 months after surgery if area is exposed to sun.  You may substitute Mederma or other scar reducing creams as desired.   

## 2013-06-16 ENCOUNTER — Other Ambulatory Visit (HOSPITAL_COMMUNITY): Payer: Self-pay | Admitting: Internal Medicine

## 2013-06-16 DIAGNOSIS — Z780 Asymptomatic menopausal state: Secondary | ICD-10-CM

## 2013-06-21 ENCOUNTER — Ambulatory Visit (HOSPITAL_COMMUNITY)
Admission: RE | Admit: 2013-06-21 | Discharge: 2013-06-21 | Disposition: A | Discharge status: Home / Self Care | Payer: Medicare Other | Source: Ambulatory Visit | Attending: Internal Medicine | Admitting: Internal Medicine

## 2013-06-21 DIAGNOSIS — M949 Disorder of cartilage, unspecified: Secondary | ICD-10-CM

## 2013-06-21 DIAGNOSIS — M899 Disorder of bone, unspecified: Secondary | ICD-10-CM | POA: Insufficient documentation

## 2013-06-21 DIAGNOSIS — Z90710 Acquired absence of both cervix and uterus: Secondary | ICD-10-CM | POA: Insufficient documentation

## 2013-06-21 DIAGNOSIS — M818 Other osteoporosis without current pathological fracture: Secondary | ICD-10-CM | POA: Insufficient documentation

## 2013-06-21 DIAGNOSIS — Z780 Asymptomatic menopausal state: Secondary | ICD-10-CM

## 2013-06-21 DIAGNOSIS — E559 Vitamin D deficiency, unspecified: Secondary | ICD-10-CM | POA: Insufficient documentation

## 2014-04-13 ENCOUNTER — Ambulatory Visit (HOSPITAL_COMMUNITY)
Admission: RE | Admit: 2014-04-13 | Discharge: 2014-04-13 | Disposition: A | Discharge status: Home / Self Care | Payer: Medicare Other | Source: Ambulatory Visit | Attending: Diagnostic Radiology | Admitting: Diagnostic Radiology

## 2014-04-13 ENCOUNTER — Other Ambulatory Visit (HOSPITAL_COMMUNITY): Payer: Self-pay | Admitting: Family Medicine

## 2014-04-13 DIAGNOSIS — M79672 Pain in left foot: Secondary | ICD-10-CM | POA: Diagnosis present

## 2014-04-13 DIAGNOSIS — W19XXXA Unspecified fall, initial encounter: Secondary | ICD-10-CM | POA: Insufficient documentation

## 2014-04-13 DIAGNOSIS — R609 Edema, unspecified: Secondary | ICD-10-CM

## 2014-04-13 DIAGNOSIS — S92355A Nondisplaced fracture of fifth metatarsal bone, left foot, initial encounter for closed fracture: Secondary | ICD-10-CM | POA: Diagnosis not present

## 2014-04-13 DIAGNOSIS — Y92512 Supermarket, store or market as the place of occurrence of the external cause: Secondary | ICD-10-CM | POA: Diagnosis not present

## 2014-04-13 DIAGNOSIS — R52 Pain, unspecified: Secondary | ICD-10-CM

## 2014-06-20 ENCOUNTER — Ambulatory Visit (HOSPITAL_COMMUNITY)
Admission: RE | Admit: 2014-06-20 | Discharge: 2014-06-20 | Disposition: A | Discharge status: Home / Self Care | Payer: Medicare Other | Source: Ambulatory Visit | Attending: Family Medicine | Admitting: Family Medicine

## 2014-06-20 ENCOUNTER — Other Ambulatory Visit (HOSPITAL_COMMUNITY): Payer: Self-pay | Admitting: Family Medicine

## 2014-06-20 DIAGNOSIS — X58XXXD Exposure to other specified factors, subsequent encounter: Secondary | ICD-10-CM | POA: Insufficient documentation

## 2014-06-20 DIAGNOSIS — M7732 Calcaneal spur, left foot: Secondary | ICD-10-CM | POA: Insufficient documentation

## 2014-06-20 DIAGNOSIS — M79672 Pain in left foot: Secondary | ICD-10-CM | POA: Diagnosis present

## 2014-06-20 DIAGNOSIS — M84378D Stress fracture, left toe(s), subsequent encounter for fracture with routine healing: Secondary | ICD-10-CM | POA: Insufficient documentation

## 2014-06-20 DIAGNOSIS — M7989 Other specified soft tissue disorders: Secondary | ICD-10-CM | POA: Insufficient documentation

## 2014-06-20 DIAGNOSIS — M2012 Hallux valgus (acquired), left foot: Secondary | ICD-10-CM | POA: Insufficient documentation

## 2014-07-29 ENCOUNTER — Other Ambulatory Visit (HOSPITAL_COMMUNITY): Payer: Self-pay | Admitting: Family Medicine

## 2014-07-29 ENCOUNTER — Ambulatory Visit (HOSPITAL_COMMUNITY)
Admission: RE | Admit: 2014-07-29 | Discharge: 2014-07-29 | Disposition: A | Discharge status: Home / Self Care | Payer: Medicare Other | Source: Ambulatory Visit | Attending: Family Medicine | Admitting: Family Medicine

## 2014-07-29 DIAGNOSIS — M84375G Stress fracture, left foot, subsequent encounter for fracture with delayed healing: Secondary | ICD-10-CM | POA: Diagnosis not present

## 2014-07-29 DIAGNOSIS — X58XXXD Exposure to other specified factors, subsequent encounter: Secondary | ICD-10-CM | POA: Diagnosis not present

## 2014-07-29 DIAGNOSIS — M8430XA Stress fracture, unspecified site, initial encounter for fracture: Secondary | ICD-10-CM

## 2014-07-29 DIAGNOSIS — M84375D Stress fracture, left foot, subsequent encounter for fracture with routine healing: Secondary | ICD-10-CM | POA: Diagnosis present

## 2014-09-30 ENCOUNTER — Ambulatory Visit (HOSPITAL_COMMUNITY)
Admission: RE | Admit: 2014-09-30 | Discharge: 2014-09-30 | Disposition: A | Discharge status: Home / Self Care | Payer: Medicare Other | Source: Ambulatory Visit | Attending: Family Medicine | Admitting: Family Medicine

## 2014-09-30 ENCOUNTER — Other Ambulatory Visit (HOSPITAL_COMMUNITY): Payer: Self-pay | Admitting: Family Medicine

## 2014-09-30 DIAGNOSIS — S92355K Nondisplaced fracture of fifth metatarsal bone, left foot, subsequent encounter for fracture with nonunion: Secondary | ICD-10-CM | POA: Diagnosis not present

## 2014-09-30 DIAGNOSIS — S92355G Nondisplaced fracture of fifth metatarsal bone, left foot, subsequent encounter for fracture with delayed healing: Secondary | ICD-10-CM

## 2014-09-30 DIAGNOSIS — X58XXXD Exposure to other specified factors, subsequent encounter: Secondary | ICD-10-CM | POA: Diagnosis not present

## 2014-12-22 ENCOUNTER — Ambulatory Visit (HOSPITAL_COMMUNITY)
Admission: RE | Admit: 2014-12-22 | Discharge: 2014-12-22 | Disposition: A | Discharge status: Home / Self Care | Payer: Medicare Other | Source: Ambulatory Visit | Attending: Family Medicine | Admitting: Family Medicine

## 2014-12-22 ENCOUNTER — Other Ambulatory Visit (HOSPITAL_COMMUNITY): Payer: Self-pay | Admitting: Family Medicine

## 2014-12-22 DIAGNOSIS — S92352K Displaced fracture of fifth metatarsal bone, left foot, subsequent encounter for fracture with nonunion: Secondary | ICD-10-CM | POA: Insufficient documentation

## 2014-12-22 DIAGNOSIS — X58XXXD Exposure to other specified factors, subsequent encounter: Secondary | ICD-10-CM | POA: Diagnosis not present

## 2014-12-22 DIAGNOSIS — M79672 Pain in left foot: Secondary | ICD-10-CM

## 2017-01-01 ENCOUNTER — Other Ambulatory Visit (HOSPITAL_COMMUNITY): Payer: Self-pay | Admitting: Family Medicine

## 2017-01-01 DIAGNOSIS — M8580 Other specified disorders of bone density and structure, unspecified site: Secondary | ICD-10-CM

## 2017-01-01 DIAGNOSIS — Z780 Asymptomatic menopausal state: Secondary | ICD-10-CM

## 2017-01-06 ENCOUNTER — Other Ambulatory Visit (HOSPITAL_COMMUNITY): Payer: Self-pay | Admitting: Family Medicine

## 2017-01-06 DIAGNOSIS — R946 Abnormal results of thyroid function studies: Secondary | ICD-10-CM

## 2017-01-08 ENCOUNTER — Ambulatory Visit (HOSPITAL_COMMUNITY)
Admission: RE | Admit: 2017-01-08 | Discharge: 2017-01-08 | Disposition: A | Discharge status: Home / Self Care | Payer: Medicare Other | Source: Ambulatory Visit | Attending: Family Medicine | Admitting: Family Medicine

## 2017-01-08 ENCOUNTER — Other Ambulatory Visit (HOSPITAL_COMMUNITY): Payer: Self-pay | Admitting: Family Medicine

## 2017-01-08 DIAGNOSIS — M4316 Spondylolisthesis, lumbar region: Secondary | ICD-10-CM | POA: Insufficient documentation

## 2017-01-08 DIAGNOSIS — M419 Scoliosis, unspecified: Secondary | ICD-10-CM | POA: Diagnosis not present

## 2017-01-08 DIAGNOSIS — M545 Low back pain: Secondary | ICD-10-CM | POA: Diagnosis present

## 2017-01-08 DIAGNOSIS — M5136 Other intervertebral disc degeneration, lumbar region: Secondary | ICD-10-CM | POA: Insufficient documentation

## 2017-01-08 DIAGNOSIS — Z780 Asymptomatic menopausal state: Secondary | ICD-10-CM

## 2017-01-08 DIAGNOSIS — M8580 Other specified disorders of bone density and structure, unspecified site: Secondary | ICD-10-CM

## 2017-01-09 ENCOUNTER — Encounter (HOSPITAL_COMMUNITY)
Admission: RE | Admit: 2017-01-09 | Discharge: 2017-01-09 | Disposition: A | Discharge status: Home / Self Care | Payer: Medicare Other | Source: Ambulatory Visit | Attending: Family Medicine | Admitting: Family Medicine

## 2017-01-09 DIAGNOSIS — R946 Abnormal results of thyroid function studies: Secondary | ICD-10-CM

## 2017-01-09 MED ORDER — SODIUM IODIDE I-123 7.4 MBQ CAPS
400.0000 | ORAL_CAPSULE | Freq: Once | ORAL | Status: AC
  Administered 2017-01-09: 393 via ORAL

## 2017-01-10 ENCOUNTER — Encounter (HOSPITAL_COMMUNITY)
Admission: RE | Admit: 2017-01-10 | Discharge: 2017-01-10 | Disposition: A | Discharge status: Home / Self Care | Payer: Medicare Other | Source: Ambulatory Visit | Attending: Family Medicine | Admitting: Family Medicine

## 2017-01-10 DIAGNOSIS — R946 Abnormal results of thyroid function studies: Secondary | ICD-10-CM | POA: Diagnosis present

## 2017-01-22 ENCOUNTER — Other Ambulatory Visit: Payer: Self-pay | Admitting: Family Medicine

## 2017-01-22 ENCOUNTER — Other Ambulatory Visit (HOSPITAL_COMMUNITY): Payer: Self-pay | Admitting: Family Medicine

## 2017-01-22 DIAGNOSIS — E041 Nontoxic single thyroid nodule: Secondary | ICD-10-CM

## 2017-01-28 ENCOUNTER — Ambulatory Visit (HOSPITAL_COMMUNITY)
Admission: RE | Admit: 2017-01-28 | Discharge: 2017-01-28 | Disposition: A | Discharge status: Home / Self Care | Payer: Medicare Other | Source: Ambulatory Visit | Attending: Family Medicine | Admitting: Family Medicine

## 2017-01-28 DIAGNOSIS — E041 Nontoxic single thyroid nodule: Secondary | ICD-10-CM | POA: Diagnosis not present

## 2017-01-31 ENCOUNTER — Other Ambulatory Visit: Payer: Self-pay | Admitting: Family Medicine

## 2017-01-31 DIAGNOSIS — E041 Nontoxic single thyroid nodule: Secondary | ICD-10-CM

## 2017-02-13 ENCOUNTER — Ambulatory Visit
Admission: RE | Admit: 2017-02-13 | Discharge: 2017-02-13 | Disposition: A | Discharge status: Home / Self Care | Payer: Medicare Other | Source: Ambulatory Visit | Attending: Family Medicine | Admitting: Family Medicine

## 2017-02-13 ENCOUNTER — Other Ambulatory Visit (HOSPITAL_COMMUNITY)
Admission: RE | Admit: 2017-02-13 | Discharge: 2017-02-13 | Disposition: A | Discharge status: Home / Self Care | Payer: Medicare Other | Source: Ambulatory Visit | Attending: Radiology | Admitting: Radiology

## 2017-02-13 DIAGNOSIS — E041 Nontoxic single thyroid nodule: Secondary | ICD-10-CM | POA: Diagnosis present

## 2017-10-13 ENCOUNTER — Encounter (HOSPITAL_COMMUNITY): Payer: Self-pay | Admitting: Emergency Medicine

## 2017-10-13 ENCOUNTER — Other Ambulatory Visit: Payer: Self-pay

## 2017-10-13 ENCOUNTER — Emergency Department (HOSPITAL_COMMUNITY)
Admission: EM | Admit: 2017-10-13 | Discharge: 2017-10-13 | Disposition: A | Discharge status: Home / Self Care | Payer: Medicare Other | Attending: Emergency Medicine | Admitting: Emergency Medicine

## 2017-10-13 DIAGNOSIS — Z7982 Long term (current) use of aspirin: Secondary | ICD-10-CM | POA: Diagnosis not present

## 2017-10-13 DIAGNOSIS — K521 Toxic gastroenteritis and colitis: Secondary | ICD-10-CM

## 2017-10-13 DIAGNOSIS — R32 Unspecified urinary incontinence: Secondary | ICD-10-CM | POA: Diagnosis present

## 2017-10-13 DIAGNOSIS — T3695XA Adverse effect of unspecified systemic antibiotic, initial encounter: Secondary | ICD-10-CM

## 2017-10-13 DIAGNOSIS — Z79899 Other long term (current) drug therapy: Secondary | ICD-10-CM | POA: Diagnosis not present

## 2017-10-13 DIAGNOSIS — R197 Diarrhea, unspecified: Secondary | ICD-10-CM | POA: Diagnosis not present

## 2017-10-13 LAB — COMPREHENSIVE METABOLIC PANEL
ALBUMIN: 3.6 g/dL (ref 3.5–5.0)
ALT: 19 U/L (ref 14–54)
AST: 26 U/L (ref 15–41)
Alkaline Phosphatase: 56 U/L (ref 38–126)
Anion gap: 11 (ref 5–15)
BUN: 13 mg/dL (ref 6–20)
CHLORIDE: 104 mmol/L (ref 101–111)
CO2: 22 mmol/L (ref 22–32)
CREATININE: 0.52 mg/dL (ref 0.44–1.00)
Calcium: 8.6 mg/dL — ABNORMAL LOW (ref 8.9–10.3)
GFR calc Af Amer: >60 mL/min (ref >60)
GFR calc non Af Amer: >60 mL/min (ref >60)
GLUCOSE: 118 mg/dL — AB (ref 65–99)
POTASSIUM: 3.3 mmol/L — AB (ref 3.5–5.1)
Sodium: 137 mmol/L (ref 135–145)
Total Bilirubin: 0.8 mg/dL (ref 0.3–1.2)
Total Protein: 6.9 g/dL (ref 6.5–8.1)

## 2017-10-13 LAB — URINALYSIS, ROUTINE W REFLEX MICROSCOPIC
Bilirubin Urine: NEGATIVE
Glucose, UA: NEGATIVE mg/dL
Hgb urine dipstick: NEGATIVE
KETONES UR: 5 mg/dL — AB
LEUKOCYTES UA: NEGATIVE
NITRITE: NEGATIVE
PH: 5 (ref 5.0–8.0)
PROTEIN: NEGATIVE mg/dL
Specific Gravity, Urine: 1.026 (ref 1.005–1.030)

## 2017-10-13 LAB — CBC WITH DIFFERENTIAL/PLATELET
Basophils Absolute: 0 10*3/uL (ref 0.0–0.1)
Basophils Relative: 0 %
EOS PCT: 0 %
Eosinophils Absolute: 0 10*3/uL (ref 0.0–0.7)
HCT: 41.7 % (ref 36.0–46.0)
Hemoglobin: 13.4 g/dL (ref 12.0–15.0)
LYMPHS ABS: 0.7 10*3/uL (ref 0.7–4.0)
LYMPHS PCT: 13 %
MCH: 28.4 pg (ref 26.0–34.0)
MCHC: 32.1 g/dL (ref 30.0–36.0)
MCV: 88.3 fL (ref 78.0–100.0)
MONO ABS: 0.5 10*3/uL (ref 0.1–1.0)
MONOS PCT: 10 %
Neutro Abs: 3.9 10*3/uL (ref 1.7–7.7)
Neutrophils Relative %: 77 %
PLATELETS: 349 10*3/uL (ref 150–400)
RBC: 4.72 MIL/uL (ref 3.87–5.11)
RDW: 13.2 % (ref 11.5–15.5)
WBC: 5.1 10*3/uL (ref 4.0–10.5)

## 2017-10-13 LAB — GASTROINTESTINAL PANEL BY PCR, STOOL (REPLACES STOOL CULTURE)
ADENOVIRUS F40/41: NOT DETECTED
Astrovirus: DETECTED — AB
CAMPYLOBACTER SPECIES: NOT DETECTED
CRYPTOSPORIDIUM: NOT DETECTED
CYCLOSPORA CAYETANENSIS: NOT DETECTED
ENTAMOEBA HISTOLYTICA: NOT DETECTED
ENTEROPATHOGENIC E COLI (EPEC): NOT DETECTED
Enteroaggregative E coli (EAEC): NOT DETECTED
Enterotoxigenic E coli (ETEC): NOT DETECTED
GIARDIA LAMBLIA: NOT DETECTED
Norovirus GI/GII: NOT DETECTED
PLESIMONAS SHIGELLOIDES: NOT DETECTED
Rotavirus A: NOT DETECTED
Salmonella species: NOT DETECTED
Sapovirus (I, II, IV, and V): NOT DETECTED
Shiga like toxin producing E coli (STEC): NOT DETECTED
Shigella/Enteroinvasive E coli (EIEC): NOT DETECTED
VIBRIO SPECIES: NOT DETECTED
Vibrio cholerae: NOT DETECTED
YERSINIA ENTEROCOLITICA: NOT DETECTED

## 2017-10-13 LAB — C DIFFICILE QUICK SCREEN W PCR REFLEX
C Diff antigen: NEGATIVE
C Diff interpretation: NOT DETECTED
C Diff toxin: NEGATIVE

## 2017-10-13 MED ORDER — METRONIDAZOLE 500 MG PO TABS: 500.0000 mg | ORAL_TABLET | Freq: Two times a day (BID) | ORAL | 0 refills | Status: DC

## 2017-10-13 MED ORDER — METRONIDAZOLE 500 MG PO TABS
500.0000 mg | ORAL_TABLET | Freq: Once | ORAL | Status: AC
  Administered 2017-10-13: 500 mg via ORAL
  Filled 2017-10-13: qty 1

## 2017-10-13 NOTE — ED Provider Notes (Signed)
[facility] EMERGENCY DEPARTMENT Provider Note   CSN: [id number] Arrival date & time: 10/13/17  0405     History   Chief Complaint Chief Complaint  Patient presents with  . Vaginal Discharge    HPI Brooke Chandler is a 77 y.o. female.  The history is provided by the patient.  She has a history of hyperlipidemia, osteoporosis, melanoma and comes in with having had 2 episodes of incontinence at home.  She woke up and about midnight to go to the bathroom.  She thought that she went to move her bowels, but noted that she had wet her closed in her bed.  She cleaned it up and went to bed and woke up again with her clothing in bed wet.  She is not 100% sure if it was with stool or with urine.  She denies any abdominal pain or fever.  Of note, she had been given a course of levofloxacin for a respiratory tract infection and she completed that about 2 weeks ago.  She denies any back pain, weakness, numbness, tingling.  Past Medical History:  Diagnosis Date  . Arthritis   . High cholesterol   . Melanoma ([place])    right leg  . Osteoporosis     Patient Active Problem List   Diagnosis Date Noted  . Melanoma of lower leg, right 12/09/2011    Past Surgical History:  Procedure Laterality Date  . ABDOMINAL HYSTERECTOMY    . BACK SURGERY    . COLONOSCOPY    . MELANOMA EXCISION  12/17/2011   Procedure: MELANOMA EXCISION;  Surgeon: Earnstine Regal, MD;  Location: [place];  Service: General;  Laterality: Right;  Wide local excision of malignant melanoma Right calf  . TONSILLECTOMY       OB History   None      Home Medications    Prior to Admission medications   Medication Sig Start Date End Date Taking? Authorizing Provider  meloxicam (MOBIC) 7.5 MG tablet Take 7.5 mg by mouth daily.   Yes [provider]  memantine (NAMENDA) 10 MG tablet Take 10 mg by mouth 2 (two) times daily.   Yes [provider]  pravastatin (PRAVACHOL) 40 MG tablet Take 40 mg  by mouth daily.   Yes [provider]  Acetaminophen (TYLENOL ARTHRITIS PAIN PO) Take 2 capsules by mouth 2 (two) times daily.    [provider]  alendronate (FOSAMAX) 10 MG tablet Take 10 mg by mouth daily before breakfast. Take with a full glass of water on an empty stomach.    [provider]  aspirin 81 MG chewable tablet Chew 81 mg by mouth daily.    [provider]  calcium-vitamin D (OSCAL WITH D) 500-200 MG-UNIT per tablet Take 1 tablet by mouth 2 (two) times daily.    [provider]  raloxifene (EVISTA) 60 MG tablet Take 60 mg by mouth at bedtime.    [provider]  vitamin B-12 (CYANOCOBALAMIN) 1000 MCG tablet Take 1,000 mcg by mouth 2 (two) times a week.    [provider]    Family History Family History  Problem Relation Age of Onset  . Heart disease Mother   . Cancer Father        prostate  . Heart disease Father     Social History Social History   Tobacco Use  . Smoking status: Never Smoker  . Smokeless tobacco: Never Used  Substance Use Topics  . Alcohol use:  No  . Drug use: No     Allergies   Bee venom   Review of Systems Review of Systems  All other systems reviewed and are negative.    Physical Exam Updated Vital Signs BP (!) 146/51 (BP Location: Left Arm)   Pulse 99   Temp (!) 97.5 F (36.4 C) (Oral)   Resp 18   Ht 5\' 7"  (1.702 m)   Wt 54.4 kg (120 lb)   SpO2 97%   BMI 18.79 kg/m   Physical Exam  Nursing note and vitals reviewed.  77 year old female, resting comfortably and in no acute distress. Vital signs are significant for mildly elevated systolic blood pressure. Oxygen saturation is 97%, which is normal. Head is normocephalic and atraumatic. PERRLA, EOMI. Oropharynx is clear. Neck is nontender and supple without adenopathy or JVD. Back is nontender and there is no CVA tenderness. Lungs are clear without rales, wheezes, or rhonchi. Chest is nontender. Heart has  regular rate and rhythm without murmur. Abdomen is soft, flat, nontender without masses or hepatosplenomegaly and peristalsis is normoactive. Extremities have no cyanosis or edema, full range of motion is present. Skin is warm and dry without rash. Neurologic: Mental status is normal, cranial nerves are intact, there are no motor or sensory deficits.  ED Treatments / Results  Labs (all labs ordered are listed, but only abnormal results are displayed) Labs Reviewed  URINALYSIS, ROUTINE W REFLEX MICROSCOPIC - Abnormal; Notable for the following components:      Result Value   APPearance HAZY (*)    Ketones, ur 5 (*)    All other components within normal limits  COMPREHENSIVE METABOLIC PANEL - Abnormal; Notable for the following components:   Potassium 3.3 (*)    Glucose, Bld 118 (*)    Calcium 8.6 (*)    All other components within normal limits  C DIFFICILE QUICK SCREEN W PCR REFLEX  GASTROINTESTINAL PANEL BY PCR, STOOL (REPLACES STOOL CULTURE)  CBC WITH DIFFERENTIAL/PLATELET   Procedures Procedures  Medications Ordered in ED Medications  metroNIDAZOLE (FLAGYL) tablet 500 mg (has no administration in time range)     Initial Impression / Assessment and Plan / ED Course  I have reviewed the triage vital signs and the nursing notes.  Pertinent labs & imaging results that were available during my care of the patient were reviewed by me and considered in my medical decision making (see chart for details).  Incontinence with patient unclear if this is stool or urine.  No evidence of any neurologic problems-no weakness, numbness.  She has given a urine sample, but it appears to be diarrhea.  On questioning, patient is not sure which she gave.  She certainly would be at risk for antibiotic associated diarrhea with recent course of levofloxacin.  Will get urine by straight cath to clarify if what she is passing his stool or urine.  Old records are reviewed, and she has no relevant past  visits.  Urinalysis is normal.  This appears to be diarrhea most likely related to recent prescription for levofloxacin.  Stool was sent for C. difficile testing as well as enteric pathogen testing.  She is discharged with a prescription for metronidazole, follow-up with PCP.  Return precautions discussed.  Final Clinical Impressions(s) / ED Diagnoses   Final diagnoses:  Antibiotic-associated diarrhea    ED Discharge Orders        Ordered    metroNIDAZOLE (FLAGYL) 500 MG tablet  2 times daily  75/43/60 6770       Delora Fuel, MD 34/03/52 [phone]

## 2017-10-13 NOTE — ED Triage Notes (Signed)
Pt reports going to bed last night and waking up with the "bed wet like she had wet the bed." Pt states she is not sure if it is coming from the "back or the front." Pt states that the substance is sticky.

## 2017-10-13 NOTE — Discharge Instructions (Addendum)
Drink plenty of fluids.  Take loperamide (Imodium AD) as needed for diarrhea.  Return if you start running a fever, start vomiting, or develop abdominal pain.

## 2018-02-09 ENCOUNTER — Telehealth: Payer: Self-pay | Admitting: Orthopaedic Surgery

## 2018-02-09 NOTE — Telephone Encounter (Signed)
Dr Nevada Crane sent over a referral in the [place].  I spoke to patient and said she wants to go to [place] [facility] [place] in [place] where her daughter goes.  I told her that she would need to call back to Dr. Juel Burrow office and let them know of this.  I will also fax something to their office letting them know that we will close this referral.

## 2019-10-06 ENCOUNTER — Ambulatory Visit (HOSPITAL_COMMUNITY)
Admission: RE | Admit: 2019-10-06 | Discharge: 2019-10-06 | Disposition: A | Discharge status: Home / Self Care | Payer: Medicare Other | Source: Ambulatory Visit | Attending: Adult Health Nurse Practitioner | Admitting: Adult Health Nurse Practitioner

## 2019-10-06 ENCOUNTER — Other Ambulatory Visit: Payer: Self-pay

## 2019-10-06 ENCOUNTER — Other Ambulatory Visit (HOSPITAL_COMMUNITY): Payer: Self-pay | Admitting: Adult Health Nurse Practitioner

## 2019-10-06 DIAGNOSIS — M25552 Pain in left hip: Secondary | ICD-10-CM | POA: Insufficient documentation

## 2019-10-06 DIAGNOSIS — M545 Low back pain, unspecified: Secondary | ICD-10-CM

## 2020-01-13 ENCOUNTER — Other Ambulatory Visit: Payer: Self-pay

## 2020-01-13 ENCOUNTER — Ambulatory Visit (HOSPITAL_COMMUNITY)
Admit: 2020-01-13 | Discharge: 2020-01-13 | Disposition: A | Discharge status: Home / Self Care | Payer: Medicare Other | Attending: Internal Medicine | Admitting: Internal Medicine

## 2020-01-13 ENCOUNTER — Ambulatory Visit
Admission: EM | Admit: 2020-01-13 | Discharge: 2020-01-13 | Disposition: A | Discharge status: Home / Self Care | Payer: Medicare Other | Attending: Emergency Medicine | Admitting: Emergency Medicine

## 2020-01-13 DIAGNOSIS — M25421 Effusion, right elbow: Secondary | ICD-10-CM | POA: Insufficient documentation

## 2020-01-13 DIAGNOSIS — S5001XA Contusion of right elbow, initial encounter: Secondary | ICD-10-CM | POA: Diagnosis present

## 2020-01-13 DIAGNOSIS — M25521 Pain in right elbow: Secondary | ICD-10-CM | POA: Insufficient documentation

## 2020-01-13 NOTE — ED Provider Notes (Addendum)
[place]   [id number] 01/13/20 Arrival Time: 4481  CC: RT elbow PAIN  SUBJECTIVE: History from: patient. Brooke Chandler is a 79 y.o. female complains of RT elbow pain and bruising x last night.  Denies a precipitating event or specific injury.  Woke up and had pain and swelling to RT elbow.  Localizes the pain to the RT elbow.  Describes the pain as intermittent and discomfort in character.  Takes mobic daily.  Symptoms are made worse with ROM.  Denies similar symptoms in the past.  Complains of swelling and bruising.  Denies fever, chills, erythema, weakness, numbness and tingling  ROS: As per HPI.  All other pertinent ROS negative.     Past Medical History:  Diagnosis Date  . Arthritis   . High cholesterol   . Melanoma ([place])    right leg  . Osteoporosis    Past Surgical History:  Procedure Laterality Date  . ABDOMINAL HYSTERECTOMY    . BACK SURGERY    . COLONOSCOPY    . MELANOMA EXCISION  12/17/2011   Procedure: MELANOMA EXCISION;  Surgeon: Earnstine Regal, MD;  Location: [place];  Service: General;  Laterality: Right;  Wide local excision of malignant melanoma Right calf  . TONSILLECTOMY     Allergies  Allergen Reactions  . Bee Venom    No current facility-administered medications on file prior to encounter.   Current Outpatient Medications on File Prior to Encounter  Medication Sig Dispense Refill  . Acetaminophen (TYLENOL ARTHRITIS PAIN PO) Take 2 capsules by mouth 2 (two) times daily.    Marland Kitchen alendronate (FOSAMAX) 10 MG tablet Take 10 mg by mouth daily before breakfast. Take with a full glass of water on an empty stomach.    . calcium-vitamin D (OSCAL WITH D) 500-200 MG-UNIT per tablet Take 1 tablet by mouth 2 (two) times daily.    . meloxicam (MOBIC) 7.5 MG tablet Take 7.5 mg by mouth daily.    . memantine (NAMENDA) 10 MG tablet Take 10 mg by mouth 2 (two) times daily.    . mirtazapine (REMERON) 15 MG tablet Take 15 mg by mouth at bedtime.     . pravastatin (PRAVACHOL) 40 MG tablet Take 40 mg by mouth daily.    . raloxifene (EVISTA) 60 MG tablet Take 60 mg by mouth at bedtime.    . vitamin B-12 (CYANOCOBALAMIN) 1000 MCG tablet Take 1,000 mcg by mouth 2 (two) times a week.     Social History   Socioeconomic History  . Marital status: Married    Spouse name: Not on file  . Number of children: Not on file  . Years of education: Not on file  . Highest education level: Not on file  Occupational History  . Not on file  Tobacco Use  . Smoking status: Never Smoker  . Smokeless tobacco: Never Used  Substance and Sexual Activity  . Alcohol use: No  . Drug use: No  . Sexual activity: Not Currently  Other Topics Concern  . Not on file  Social History Narrative  . Not on file   Social Determinants of Health   Financial Resource Strain:   . Difficulty of Paying Living Expenses:   Food Insecurity:   . Worried About Charity fundraiser in the Last Year:   . Arboriculturist in the Last Year:   Transportation Needs:   . Film/video editor (Medical):   Marland Kitchen Lack of Transportation (Non-Medical):  Physical Activity:   . Days of Exercise per Week:   . Minutes of Exercise per Session:   Stress:   . Feeling of Stress :   Social Connections:   . Frequency of Communication with Friends and Family:   . Frequency of Social Gatherings with Friends and Family:   . Attends Religious Services:   . Active Member of Clubs or Organizations:   . Attends Archivist Meetings:   Marland Kitchen Marital Status:   Intimate Partner Violence:   . Fear of Current or Ex-Partner:   . Emotionally Abused:   Marland Kitchen Physically Abused:   . Sexually Abused:    Family History  Problem Relation Age of Onset  . Heart disease Mother   . Cancer Father        prostate  . Heart disease Father     OBJECTIVE:  Vitals:   01/13/20 1423  BP: (!) 115/60  Pulse: 68  Resp: 18  Temp: 97.6 F (36.4 C)  SpO2: 97%    General appearance: ALERT; in no acute  distress.  Head: NCAT Lungs: Normal respiratory effort CV: Radial pulse  2+ . Cap refill < 2 seconds Musculoskeletal: RT elbow Inspection: Swelling and ecchymosis of posterior aspect of elbow Palpation: TTP over olecranon process ROM: LROM Strength: 5/5 elbow flexion, 5/5 elbow extension, 5/5 grip strength Skin: warm and dry Neurologic: Ambulates without difficulty; Sensation intact about the upper/ lower extremities Psychological: alert and cooperative; normal mood and affect   ASSESSMENT & PLAN:  1. Right elbow pain   2. Elbow swelling, right   3. Contusion of right elbow, initial encounter    X-rays ordered.  Pending radiologist interpretation.  We will follow up with you regarding abnormal results Continue conservative management of rest, ice, and elevation Sling and splint discontinued.   Continue with mobic.   Follow up with Raliegh Ip as needed Return or go to the ER if you have any new or worsening symptoms (fever, chills, chest pain, redness, swelling, bruising, deformity, etc...)   Reviewed expectations re: course of current medical issues. Questions answered. Outlined signs and symptoms indicating need for more acute intervention. Patient verbalized understanding. After Visit Summary given.    Lestine Box, PA-C 01/13/20 1540  ADDENDUM:   DG ELBOW COMPLETE RIGHT (3+VIEW)  Result Date: 01/13/2020 CLINICAL DATA:  Acute right elbow pain and swelling without known injury. EXAM: RIGHT ELBOW - COMPLETE 3+ VIEW COMPARISON:  None. FINDINGS: There is no evidence of fracture, dislocation, or joint effusion. There is no evidence of arthropathy or other focal bone abnormality. Soft tissues are unremarkable. IMPRESSION: Negative. Electronically Signed   By: Marijo Conception M.D.   On: 01/13/2020 15:59    X-rays negative for bony abnormalities including fracture, or dislocation.  No soft tissue swelling.    I have reviewed the x-rays myself and the radiologist  interpretation. I am in agreement with the radiologist interpretation.       Lestine Box, PA-C 01/13/20 1620

## 2020-01-13 NOTE — ED Triage Notes (Signed)
Pt took a nap today and woke up with right hematoma to right elbow, does not know what happened but is painful

## 2020-01-13 NOTE — Discharge Instructions (Addendum)
X-rays ordered.  Pending radiologist interpretation.  We will follow up with you regarding abnormal results Continue conservative management of rest, ice, and elevation Continue with mobic.   Follow up with Raliegh Ip as needed Return or go to the ER if you have any new or worsening symptoms (fever, chills, chest pain, redness, swelling, bruising, deformity, etc...)

## 2020-10-10 ENCOUNTER — Other Ambulatory Visit (HOSPITAL_COMMUNITY): Payer: Self-pay | Admitting: Internal Medicine

## 2020-10-10 DIAGNOSIS — Z13820 Encounter for screening for osteoporosis: Secondary | ICD-10-CM

## 2020-10-20 ENCOUNTER — Ambulatory Visit (HOSPITAL_COMMUNITY)
Admission: RE | Admit: 2020-10-20 | Discharge: 2020-10-20 | Disposition: A | Discharge status: Home / Self Care | Payer: Medicare Other | Source: Ambulatory Visit | Attending: Family Medicine | Admitting: Family Medicine

## 2020-10-20 ENCOUNTER — Other Ambulatory Visit: Payer: Self-pay

## 2020-10-20 ENCOUNTER — Other Ambulatory Visit (HOSPITAL_COMMUNITY): Payer: Self-pay | Admitting: Family Medicine

## 2020-10-20 DIAGNOSIS — K5900 Constipation, unspecified: Secondary | ICD-10-CM | POA: Insufficient documentation

## 2020-10-23 ENCOUNTER — Other Ambulatory Visit: Payer: Self-pay

## 2020-10-23 ENCOUNTER — Ambulatory Visit (HOSPITAL_COMMUNITY)
Admission: RE | Admit: 2020-10-23 | Discharge: 2020-10-23 | Disposition: A | Discharge status: Home / Self Care | Payer: Medicare Other | Source: Ambulatory Visit | Attending: Internal Medicine | Admitting: Internal Medicine

## 2020-10-23 DIAGNOSIS — E559 Vitamin D deficiency, unspecified: Secondary | ICD-10-CM | POA: Insufficient documentation

## 2020-10-23 DIAGNOSIS — Z13820 Encounter for screening for osteoporosis: Secondary | ICD-10-CM | POA: Diagnosis present

## 2020-10-23 DIAGNOSIS — Z780 Asymptomatic menopausal state: Secondary | ICD-10-CM | POA: Insufficient documentation

## 2020-10-23 DIAGNOSIS — M85832 Other specified disorders of bone density and structure, left forearm: Secondary | ICD-10-CM | POA: Insufficient documentation

## 2020-11-16 ENCOUNTER — Ambulatory Visit (HOSPITAL_COMMUNITY)
Admission: RE | Admit: 2020-11-16 | Discharge: 2020-11-16 | Disposition: A | Discharge status: Home / Self Care | Payer: Medicare Other | Source: Ambulatory Visit | Attending: Family Medicine | Admitting: Family Medicine

## 2020-11-16 ENCOUNTER — Other Ambulatory Visit (HOSPITAL_COMMUNITY): Payer: Self-pay | Admitting: Family Medicine

## 2020-11-16 ENCOUNTER — Other Ambulatory Visit: Payer: Self-pay

## 2020-11-16 DIAGNOSIS — R0781 Pleurodynia: Secondary | ICD-10-CM | POA: Insufficient documentation

## 2020-11-28 ENCOUNTER — Emergency Department (HOSPITAL_COMMUNITY)
Admission: EM | Admit: 2020-11-28 | Discharge: 2020-11-28 | Disposition: A | Discharge status: Home / Self Care | Payer: Medicare Other | Attending: Emergency Medicine | Admitting: Emergency Medicine

## 2020-11-28 ENCOUNTER — Other Ambulatory Visit: Payer: Self-pay

## 2020-11-28 ENCOUNTER — Encounter (HOSPITAL_COMMUNITY): Payer: Self-pay | Admitting: Emergency Medicine

## 2020-11-28 ENCOUNTER — Emergency Department (HOSPITAL_COMMUNITY): Payer: Medicare Other

## 2020-11-28 DIAGNOSIS — F0390 Unspecified dementia without behavioral disturbance: Secondary | ICD-10-CM | POA: Diagnosis not present

## 2020-11-28 DIAGNOSIS — W19XXXA Unspecified fall, initial encounter: Secondary | ICD-10-CM | POA: Diagnosis not present

## 2020-11-28 DIAGNOSIS — Z85828 Personal history of other malignant neoplasm of skin: Secondary | ICD-10-CM | POA: Insufficient documentation

## 2020-11-28 DIAGNOSIS — R0781 Pleurodynia: Secondary | ICD-10-CM | POA: Insufficient documentation

## 2020-11-28 MED ORDER — ACETAMINOPHEN 500 MG PO TABS
1000.0000 mg | ORAL_TABLET | Freq: Once | ORAL | Status: AC
  Administered 2020-11-28: 1000 mg via ORAL
  Filled 2020-11-28: qty 2

## 2020-11-28 NOTE — ED Provider Notes (Signed)
[facility] EMERGENCY DEPARTMENT Provider Note   CSN: [id number] Arrival date & time: 11/28/20  1737     History Chief Complaint  Patient presents with   Lytle Michaels    Brooke Chandler is a 80 y.o. female who had a witnessed fall from standing and presents to the emergency department for evaluation.  Her daughter is at the bedside.  Patient has underlying fairly advanced dementia.  According to the patient's daughter she arrived at the patient's home this afternoon and was standing at the screen door.  When the patient stood up from the couch to, open the door, she lost her balance and fell backwards onto her buttocks and back rolling into the couch.  She was unable to get up on her own.  The patient's husband opened the door and he and the patient's daughter were able to get the patient standing and then seated in the chair without difficulty.  The patient presents at the prompting of her daughter for evaluation of possible broken hip, the patient denies any hip pain at this time.  She is pleasantly demented at baseline.  I personally reviewed this patient's medical records.  She has history of dementia, melanoma, osteoporosis, and scoliosis.  She is not on any anticoagulation.  HPI     Past Medical History:  Diagnosis Date   Arthritis    Dementia ([place])    High cholesterol    Melanoma ([place])    right leg   Osteoporosis    Scoliosis     Patient Active Problem List   Diagnosis Date Noted   Melanoma of lower leg, right 12/09/2011    Past Surgical History:  Procedure Laterality Date   ABDOMINAL HYSTERECTOMY     BACK SURGERY     COLONOSCOPY     MELANOMA EXCISION  12/17/2011   Procedure: MELANOMA EXCISION;  Surgeon: Earnstine Regal, MD;  Location: [place];  Service: General;  Laterality: Right;  Wide local excision of malignant melanoma Right calf   TONSILLECTOMY       OB History   No obstetric history on file.     Family History  Problem Relation Age of Onset    Heart disease Mother    Cancer Father        prostate   Heart disease Father     Social History   Tobacco Use   Smoking status: Never   Smokeless tobacco: Never  Vaping Use   Vaping Use: Never used  Substance Use Topics   Alcohol use: No   Drug use: No    Home Medications Prior to Admission medications   Medication Sig Start Date End Date Taking? Authorizing Provider  Acetaminophen (TYLENOL ARTHRITIS PAIN PO) Take 2 capsules by mouth 2 (two) times daily.    [provider]  alendronate (FOSAMAX) 10 MG tablet Take 10 mg by mouth daily before breakfast. Take with a full glass of water on an empty stomach.    [provider]  calcium-vitamin D (OSCAL WITH D) 500-200 MG-UNIT per tablet Take 1 tablet by mouth 2 (two) times daily.    [provider]  meloxicam (MOBIC) 7.5 MG tablet Take 7.5 mg by mouth daily.    [provider]  memantine (NAMENDA) 10 MG tablet Take 10 mg by mouth 2 (two) times daily.    [provider]  mirtazapine (REMERON) 15 MG tablet Take 15 mg by mouth at bedtime. 01/02/20   [provider]  pravastatin (PRAVACHOL) 40  MG tablet Take 40 mg by mouth daily.    [provider]  raloxifene (EVISTA) 60 MG tablet Take 60 mg by mouth at bedtime.    [provider]  vitamin B-12 (CYANOCOBALAMIN) 1000 MCG tablet Take 1,000 mcg by mouth 2 (two) times a week.    [provider]    Allergies    Bee venom  Review of Systems   Review of Systems  Constitutional: Negative.   HENT: Negative.    Eyes: Negative.   Respiratory: Negative.    Cardiovascular: Negative.   Gastrointestinal: Negative.   Genitourinary: Negative.   Musculoskeletal:  Positive for arthralgias and myalgias.  Neurological: Negative.   Hematological:  Does not bruise/bleed easily.   Physical Exam Updated Vital Signs BP 127/81   Pulse 67   Temp 98.6 F (37 C) (Oral)   Resp 18   Ht 5\' 1"  (1.549 m)   Wt 42.2 kg    SpO2 100%   BMI 17.57 kg/m   Physical Exam Vitals and nursing note reviewed.  Constitutional:      General: She is not in acute distress.    Appearance: She is not ill-appearing or toxic-appearing.  HENT:     Head: Normocephalic and atraumatic.     Nose: Nose normal.     Mouth/Throat:     Mouth: Mucous membranes are moist.     Pharynx: Oropharynx is clear. Uvula midline. No oropharyngeal exudate, posterior oropharyngeal erythema or uvula swelling.     Tonsils: No tonsillar exudate.  Eyes:     General: Lids are normal. Vision grossly intact.        Right eye: No discharge.        Left eye: No discharge.     Extraocular Movements: Extraocular movements intact.     Conjunctiva/sclera: Conjunctivae normal.     Pupils: Pupils are equal, round, and reactive to light.  Neck:     Trachea: Trachea and phonation normal.  Cardiovascular:     Rate and Rhythm: Normal rate and regular rhythm.     Pulses: Normal pulses.     Heart sounds: Normal heart sounds. No murmur heard. Pulmonary:     Effort: Pulmonary effort is normal. No tachypnea, bradypnea, accessory muscle usage, prolonged expiration or respiratory distress.     Breath sounds: Normal breath sounds. No wheezing or rales.  Chest:     Chest wall: Tenderness present. No mass, lacerations, deformity, swelling, crepitus or edema.    Abdominal:     General: Bowel sounds are normal. There is no distension.     Tenderness: There is no abdominal tenderness.  Musculoskeletal:        General: No deformity.     Cervical back: Normal range of motion and neck supple. No edema, rigidity or crepitus. No pain with movement or spinous process tenderness.     Right lower leg: No edema.     Left lower leg: No edema.     Comments: Symmetric strength and sensation in upper and lower extremities bilaterally.  Full range of motion of the hips and knees bilaterally as well as the shoulders, elbows, and wrists bilaterally.  Scoliosis of the spine.   Lymphadenopathy:     Cervical: No cervical adenopathy.  Skin:    General: Skin is warm and dry.  Neurological:     Mental Status: She is alert. Mental status is at baseline.     Sensory: Sensation is intact.     Motor: Motor function is intact.  Gait: Gait is intact.     Comments: Patient ambulated by this provider and her daughter at the bedside, able to ambulate with minimal assistance, uses a walker at home.  Psychiatric:        Mood and Affect: Mood normal.    ED Results / Procedures / Treatments   Labs (all labs ordered are listed, but only abnormal results are displayed) Labs Reviewed - No data to display  EKG EKG Interpretation  Date/Time:  Tuesday November 28 2020 20:10:05 EDT Ventricular Rate:  65 PR Interval:  138 QRS Duration: 88 QT Interval:  405 QTC Calculation: 422 R Axis:   61 Text Interpretation: Sinus rhythm Atrial premature complex Confirmed by Quintella Reichert [id number]) on 11/29/2020 7:19:55 PM  Radiology   Procedures Procedures   Medications Ordered in ED Medications  acetaminophen (TYLENOL) tablet 1,000 mg (1,000 mg Oral Given 11/28/20 2037)    ED Course  I have reviewed the triage vital signs and the nursing notes.  Pertinent labs & imaging results that were available during my care of the patient were reviewed by me and considered in my medical decision making (see chart for details).    MDM Rules/Calculators/A&P                         80 year old female who presents after witnessed mechanical fall for medical evaluation.  Vital signs are normal on intake.  Cardiopulmonary exam is normal, abdominal exam is benign.  Patient is neurovascularly intact in all 4 extremities, and is generally without signs of trauma.  She does have some tenderness to palpation over the right anterolateral ribs.  EKG without dysrhythmia or ischemic changes.  Plain films of the hips and pelvis were negative for acute fracture or dislocation, CT of the head was  negative for acute intracranial abnormality.  Plain film of the ribs and chest revealed a healing fracture in the posterior lateral right seventh rib without evidence of acute fracture.  Patient reevaluated and ambulated by this provider in the room without difficulty.  No further work-up warranted in ED at this time.  Suspect patient will be sore from history of remote rib fracture as well as from today's fall, however reassuringly there is no acute fracture today and no intracranial hemorrhage.  Brooke and her daughter voiced understanding of her medical evaluation and treatment plan.  Each of their questions was answered to their expressed satisfaction.  Return precautions were given.  Leilyn is well-appearing, stable, and appropriate for discharge at this time.  This chart was dictated using voice recognition software, Dragon. Despite the best efforts of this provider to proofread and correct errors, errors may still occur which can change documentation meaning.  Final Clinical Impression(s) / ED Diagnoses Final diagnoses:  Fall, initial encounter    Rx / DC Orders ED Discharge Orders     None        Aura Dials 12/01/20 0243    Davonna Belling, MD 12/04/20 [phone]

## 2020-11-28 NOTE — ED Triage Notes (Signed)
Pt to the ED after a fall with right back pain.  Pt did not hit her head and denies LOC.  Pt does not take blood thinners.

## 2020-11-28 NOTE — Discharge Instructions (Addendum)
Brooke Chandler was seen in the ER today after her fall.  Her physical exam and vital signs are very reassuring as was her CT of her head and her x-rays.  She has evidence of a broken rib on the right side, however this appears to have been from a previous injury and not from today's fall.  There are no other evidence of broken ribs, there is no fracture in her hips.  There is no bleeding in her brain.  Continue use Tylenol and her tramadol at home as needed for her pain.  Please follow-up closely with her primary care doctor.  Return to ER if she develops any worsening confusion, nausea or vomiting does not stop, or any other new severe symptoms.

## 2021-05-02 ENCOUNTER — Other Ambulatory Visit: Payer: Self-pay

## 2021-05-02 ENCOUNTER — Ambulatory Visit (HOSPITAL_COMMUNITY)
Admission: RE | Admit: 2021-05-02 | Discharge: 2021-05-02 | Disposition: A | Discharge status: Home / Self Care | Payer: Medicare Other | Source: Ambulatory Visit | Attending: Family Medicine | Admitting: Family Medicine

## 2021-05-02 ENCOUNTER — Other Ambulatory Visit (HOSPITAL_COMMUNITY): Payer: Self-pay | Admitting: Family Medicine

## 2021-05-02 DIAGNOSIS — R0781 Pleurodynia: Secondary | ICD-10-CM | POA: Diagnosis present

## 2021-05-10 IMAGING — DX DG THORACIC SPINE 3V
3 series · 3 of 3 positions shown · non-contrast
Comparison: None.

CLINICAL DATA: Thoracic spine pain without known injury.

EXAM:
THORACIC SPINE - 3 VIEWS

[t-spine ap]
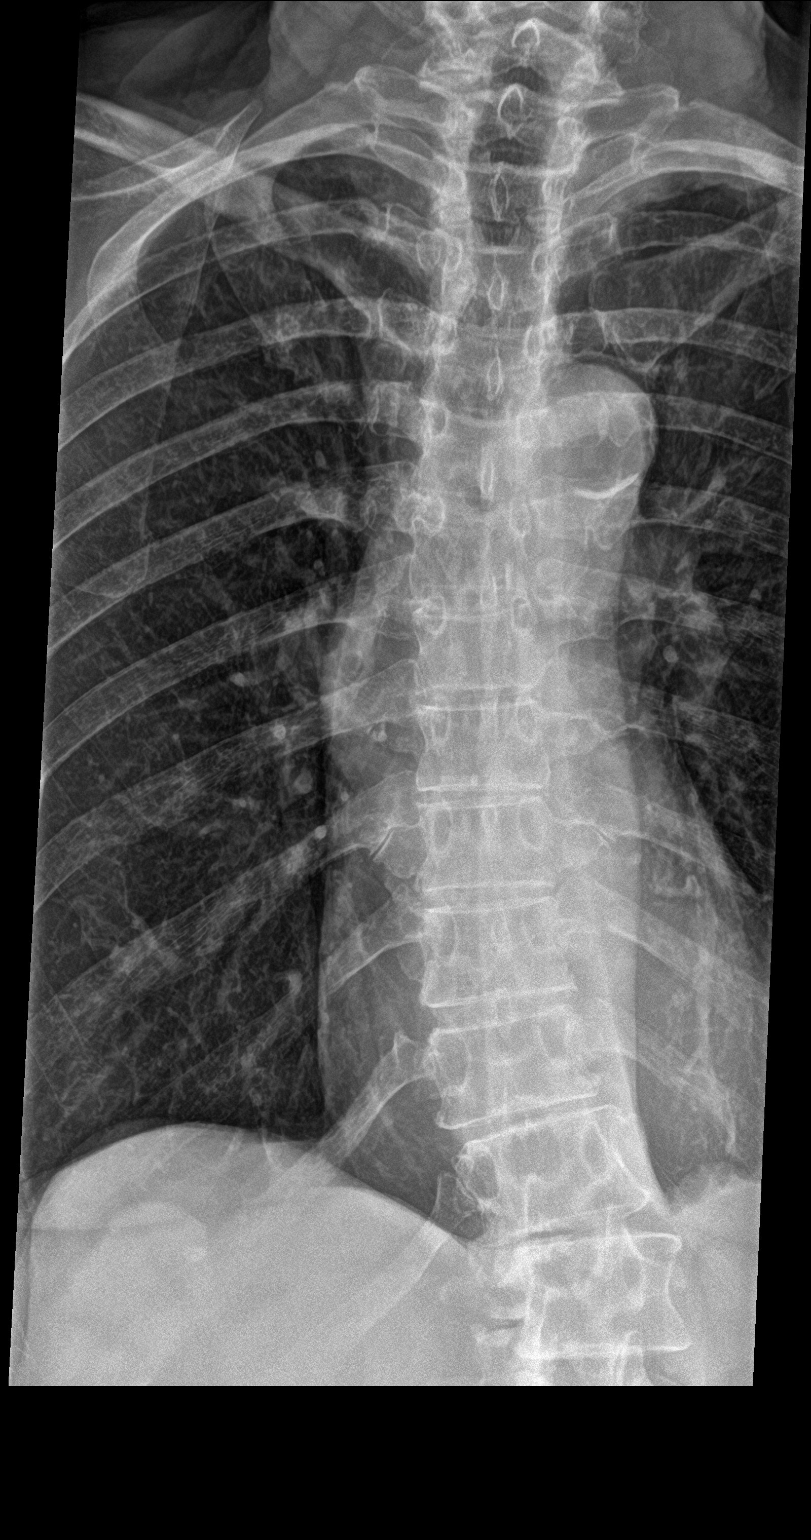

[t-spine lat]
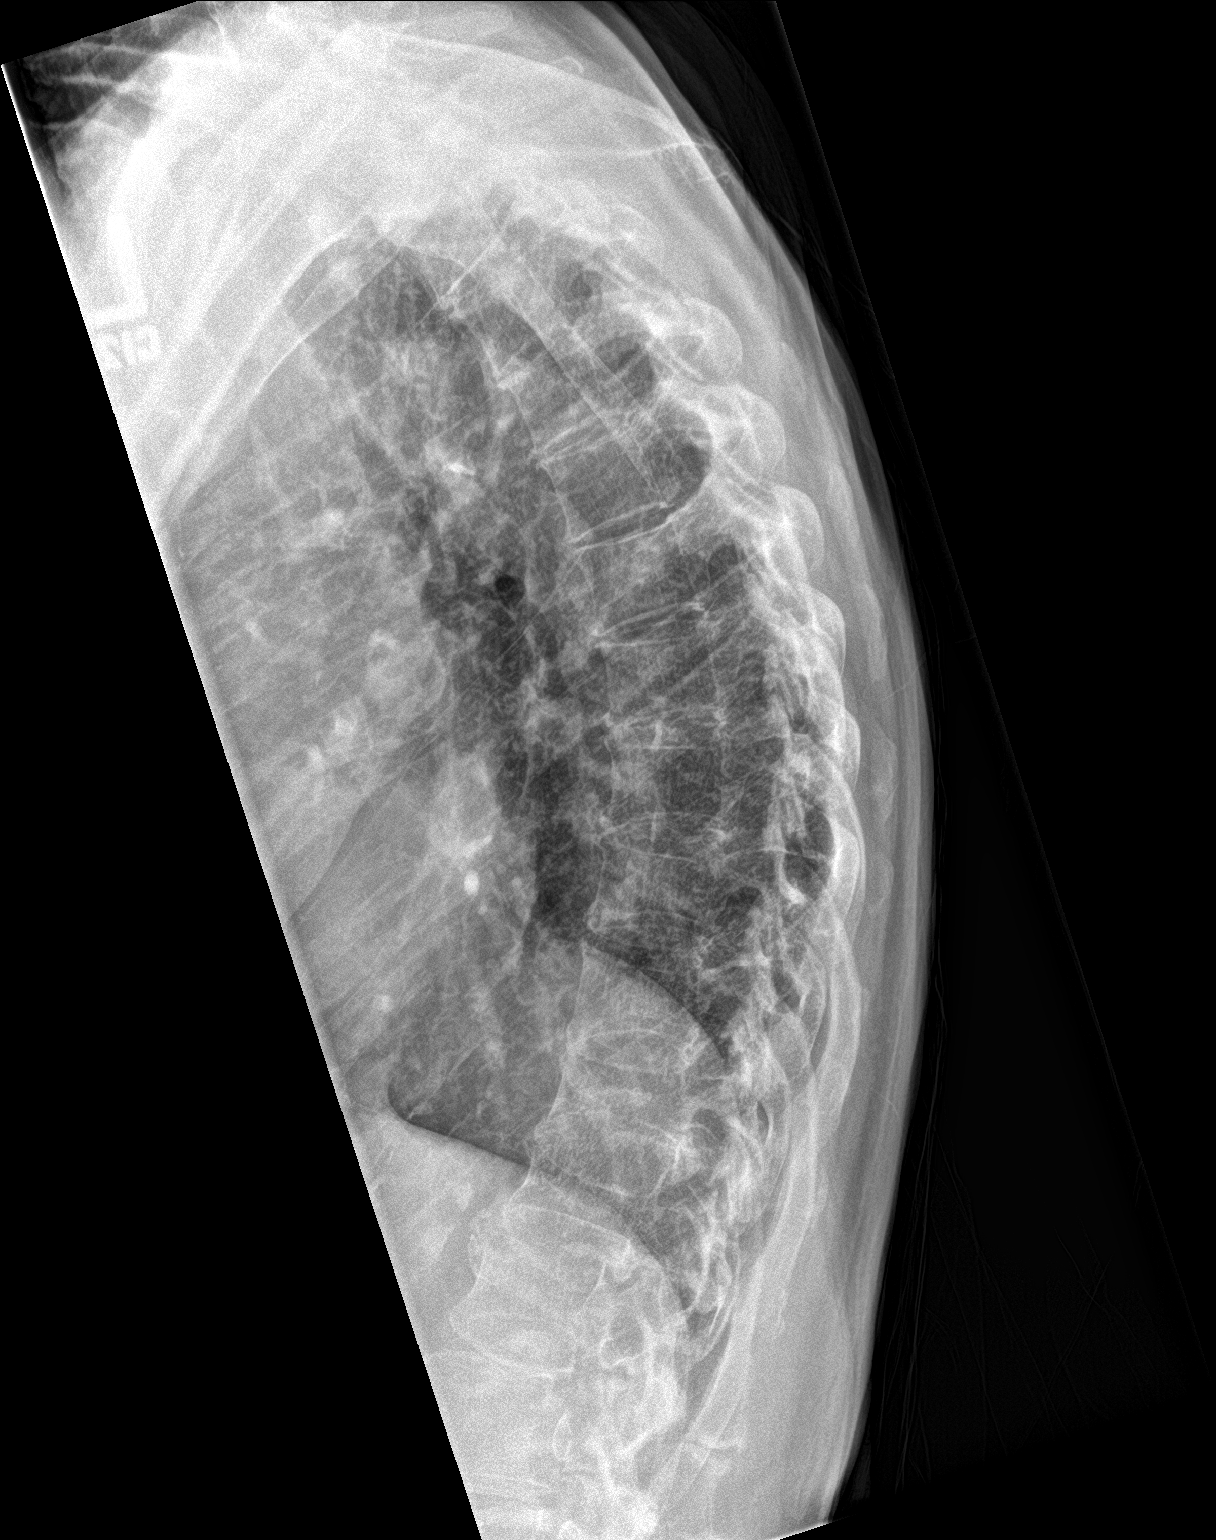

[t-spine swimmers]
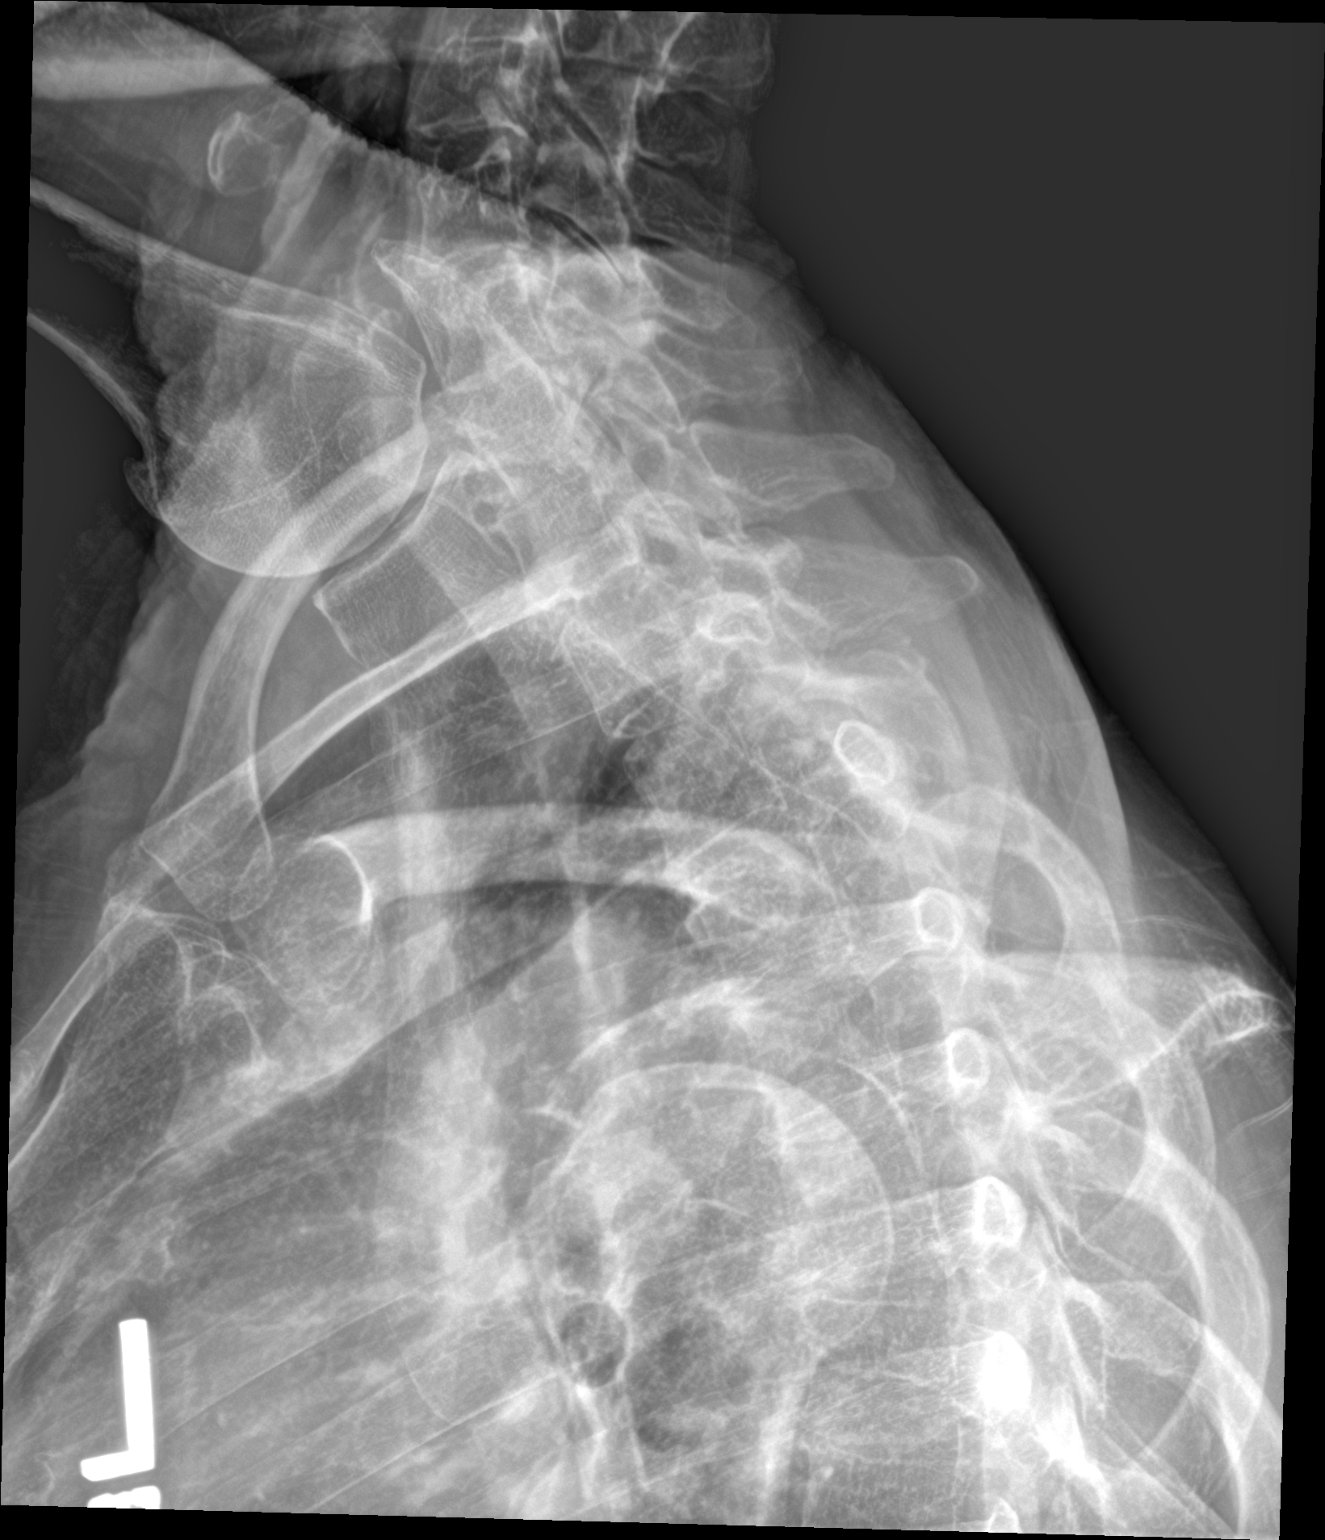

[3 of 3 positions shown; findings below may reference images not displayed]

FINDINGS: No fracture or spondylolisthesis is noted. Mild dextroscoliosis of
thoracic spine is noted. Mild degenerative disc disease is noted in
the lower thoracic spine.
IMPRESSION: Mild multilevel degenerative disc disease. No acute abnormality seen
in the thoracic spine.

## 2021-12-15 DEATH — deceased
